# Patient Record
Sex: Male | Born: 1950 | Race: White | Hispanic: No | Smoking: Never smoker
Health system: Southern US, Community
[De-identification: ages and names within clinical notes are randomized; demographics above are authoritative.]

## PROBLEM LIST (undated history)

## (undated) DIAGNOSIS — T4145XA Adverse effect of unspecified anesthetic, initial encounter: Secondary | ICD-10-CM

## (undated) DIAGNOSIS — K859 Acute pancreatitis without necrosis or infection, unspecified: Secondary | ICD-10-CM

## (undated) DIAGNOSIS — I10 Essential (primary) hypertension: Secondary | ICD-10-CM

## (undated) DIAGNOSIS — I4891 Unspecified atrial fibrillation: Secondary | ICD-10-CM

## (undated) DIAGNOSIS — M4800 Spinal stenosis, site unspecified: Secondary | ICD-10-CM

## (undated) DIAGNOSIS — G0490 Encephalitis and encephalomyelitis, unspecified: Secondary | ICD-10-CM

## (undated) DIAGNOSIS — T8859XA Other complications of anesthesia, initial encounter: Secondary | ICD-10-CM

## (undated) DIAGNOSIS — L0390 Cellulitis, unspecified: Secondary | ICD-10-CM

## (undated) DIAGNOSIS — M459 Ankylosing spondylitis of unspecified sites in spine: Secondary | ICD-10-CM

## (undated) DIAGNOSIS — I639 Cerebral infarction, unspecified: Secondary | ICD-10-CM

## (undated) DIAGNOSIS — E119 Type 2 diabetes mellitus without complications: Secondary | ICD-10-CM

## (undated) DIAGNOSIS — R569 Unspecified convulsions: Secondary | ICD-10-CM

## (undated) HISTORY — PX: OTHER SURGICAL HISTORY: SHX169

## (undated) HISTORY — PX: TONSILLECTOMY: SUR1361

## (undated) HISTORY — PX: CATARACT EXTRACTION: SUR2

---

## 2011-04-16 DIAGNOSIS — G0490 Encephalitis and encephalomyelitis, unspecified: Secondary | ICD-10-CM

## 2011-04-16 HISTORY — DX: Encephalitis and encephalomyelitis, unspecified: G04.90

## 2015-03-28 ENCOUNTER — Encounter (HOSPITAL_COMMUNITY): Payer: Self-pay | Admitting: Emergency Medicine

## 2015-03-28 ENCOUNTER — Inpatient Hospital Stay (HOSPITAL_COMMUNITY)
Admission: EM | Admit: 2015-03-28 | Discharge: 2015-04-16 | DRG: 871 | Disposition: E | Payer: Medicare Other | Attending: Internal Medicine | Admitting: Internal Medicine

## 2015-03-28 ENCOUNTER — Emergency Department (HOSPITAL_COMMUNITY): Payer: Medicare Other

## 2015-03-28 ENCOUNTER — Inpatient Hospital Stay (HOSPITAL_COMMUNITY): Payer: Medicare Other

## 2015-03-28 DIAGNOSIS — L03119 Cellulitis of unspecified part of limb: Secondary | ICD-10-CM

## 2015-03-28 DIAGNOSIS — Z6828 Body mass index (BMI) 28.0-28.9, adult: Secondary | ICD-10-CM

## 2015-03-28 DIAGNOSIS — Z8661 Personal history of infections of the central nervous system: Secondary | ICD-10-CM | POA: Diagnosis not present

## 2015-03-28 DIAGNOSIS — Z7901 Long term (current) use of anticoagulants: Secondary | ICD-10-CM | POA: Diagnosis not present

## 2015-03-28 DIAGNOSIS — Z515 Encounter for palliative care: Secondary | ICD-10-CM | POA: Diagnosis not present

## 2015-03-28 DIAGNOSIS — Z794 Long term (current) use of insulin: Secondary | ICD-10-CM | POA: Diagnosis not present

## 2015-03-28 DIAGNOSIS — G894 Chronic pain syndrome: Secondary | ICD-10-CM | POA: Diagnosis present

## 2015-03-28 DIAGNOSIS — E1165 Type 2 diabetes mellitus with hyperglycemia: Secondary | ICD-10-CM | POA: Diagnosis present

## 2015-03-28 DIAGNOSIS — J9601 Acute respiratory failure with hypoxia: Secondary | ICD-10-CM | POA: Diagnosis present

## 2015-03-28 DIAGNOSIS — R1310 Dysphagia, unspecified: Secondary | ICD-10-CM | POA: Diagnosis present

## 2015-03-28 DIAGNOSIS — M459 Ankylosing spondylitis of unspecified sites in spine: Secondary | ICD-10-CM | POA: Diagnosis not present

## 2015-03-28 DIAGNOSIS — Z8673 Personal history of transient ischemic attack (TIA), and cerebral infarction without residual deficits: Secondary | ICD-10-CM | POA: Diagnosis not present

## 2015-03-28 DIAGNOSIS — L03116 Cellulitis of left lower limb: Secondary | ICD-10-CM | POA: Diagnosis not present

## 2015-03-28 DIAGNOSIS — E1140 Type 2 diabetes mellitus with diabetic neuropathy, unspecified: Secondary | ICD-10-CM | POA: Diagnosis present

## 2015-03-28 DIAGNOSIS — I482 Chronic atrial fibrillation: Secondary | ICD-10-CM | POA: Diagnosis present

## 2015-03-28 DIAGNOSIS — Z66 Do not resuscitate: Secondary | ICD-10-CM | POA: Diagnosis present

## 2015-03-28 DIAGNOSIS — L8990 Pressure ulcer of unspecified site, unspecified stage: Secondary | ICD-10-CM | POA: Diagnosis not present

## 2015-03-28 DIAGNOSIS — N390 Urinary tract infection, site not specified: Secondary | ICD-10-CM | POA: Diagnosis present

## 2015-03-28 DIAGNOSIS — E872 Acidosis: Secondary | ICD-10-CM | POA: Diagnosis present

## 2015-03-28 DIAGNOSIS — I10 Essential (primary) hypertension: Secondary | ICD-10-CM | POA: Diagnosis present

## 2015-03-28 DIAGNOSIS — A419 Sepsis, unspecified organism: Principal | ICD-10-CM | POA: Diagnosis present

## 2015-03-28 DIAGNOSIS — D638 Anemia in other chronic diseases classified elsewhere: Secondary | ICD-10-CM | POA: Diagnosis present

## 2015-03-28 DIAGNOSIS — Z7401 Bed confinement status: Secondary | ICD-10-CM | POA: Diagnosis not present

## 2015-03-28 DIAGNOSIS — Z9981 Dependence on supplemental oxygen: Secondary | ICD-10-CM | POA: Diagnosis not present

## 2015-03-28 DIAGNOSIS — Z79899 Other long term (current) drug therapy: Secondary | ICD-10-CM

## 2015-03-28 DIAGNOSIS — Z7952 Long term (current) use of systemic steroids: Secondary | ICD-10-CM

## 2015-03-28 DIAGNOSIS — F0390 Unspecified dementia without behavioral disturbance: Secondary | ICD-10-CM | POA: Diagnosis present

## 2015-03-28 DIAGNOSIS — E119 Type 2 diabetes mellitus without complications: Secondary | ICD-10-CM

## 2015-03-28 DIAGNOSIS — M549 Dorsalgia, unspecified: Secondary | ICD-10-CM | POA: Diagnosis present

## 2015-03-28 DIAGNOSIS — R4182 Altered mental status, unspecified: Secondary | ICD-10-CM | POA: Diagnosis not present

## 2015-03-28 DIAGNOSIS — R609 Edema, unspecified: Secondary | ICD-10-CM

## 2015-03-28 HISTORY — DX: Adverse effect of unspecified anesthetic, initial encounter: T41.45XA

## 2015-03-28 HISTORY — DX: Type 2 diabetes mellitus without complications: E11.9

## 2015-03-28 HISTORY — DX: Other complications of anesthesia, initial encounter: T88.59XA

## 2015-03-28 HISTORY — DX: Encephalitis and encephalomyelitis, unspecified: G04.90

## 2015-03-28 HISTORY — DX: Cerebral infarction, unspecified: I63.9

## 2015-03-28 HISTORY — DX: Acute pancreatitis without necrosis or infection, unspecified: K85.90

## 2015-03-28 HISTORY — DX: Spinal stenosis, site unspecified: M48.00

## 2015-03-28 HISTORY — DX: Essential (primary) hypertension: I10

## 2015-03-28 HISTORY — DX: Unspecified convulsions: R56.9

## 2015-03-28 HISTORY — DX: Cellulitis, unspecified: L03.90

## 2015-03-28 HISTORY — DX: Unspecified atrial fibrillation: I48.91

## 2015-03-28 HISTORY — DX: Ankylosing spondylitis of unspecified sites in spine: M45.9

## 2015-03-28 LAB — CBG MONITORING, ED
GLUCOSE-CAPILLARY: 318 mg/dL — AB (ref 65–99)
GLUCOSE-CAPILLARY: 359 mg/dL — AB (ref 65–99)
GLUCOSE-CAPILLARY: 380 mg/dL — AB (ref 65–99)
Glucose-Capillary: 367 mg/dL — ABNORMAL HIGH (ref 65–99)
Glucose-Capillary: 355 mg/dL — ABNORMAL HIGH (ref 65–99)

## 2015-03-28 LAB — URINALYSIS, ROUTINE W REFLEX MICROSCOPIC
Bilirubin Urine: NEGATIVE
Glucose, UA: >1000 mg/dL — AB
Ketones, ur: 40 mg/dL — AB
NITRITE: NEGATIVE
PH: 7 (ref 5.0–8.0)
Protein, ur: 30 mg/dL — AB
SPECIFIC GRAVITY, URINE: 1.018 (ref 1.005–1.030)

## 2015-03-28 LAB — I-STAT CHEM 8, ED
BUN: 15 mg/dL (ref 6–20)
CHLORIDE: 100 mmol/L — AB (ref 101–111)
CREATININE: 0.9 mg/dL (ref 0.61–1.24)
Calcium, Ion: 1.04 mmol/L — ABNORMAL LOW (ref 1.13–1.30)
Glucose, Bld: 401 mg/dL — ABNORMAL HIGH (ref 65–99)
HEMATOCRIT: 40 % (ref 39.0–52.0)
Hemoglobin: 13.6 g/dL (ref 13.0–17.0)
POTASSIUM: 3.7 mmol/L (ref 3.5–5.1)
SODIUM: 139 mmol/L (ref 135–145)
TCO2: 25 mmol/L (ref 0–100)

## 2015-03-28 LAB — URINE MICROSCOPIC-ADD ON: SQUAMOUS EPITHELIAL / LPF: NONE SEEN

## 2015-03-28 LAB — CBC
HEMATOCRIT: 36 % — AB (ref 39.0–52.0)
HEMOGLOBIN: 10.9 g/dL — AB (ref 13.0–17.0)
MCH: 26.1 pg (ref 26.0–34.0)
MCHC: 30.3 g/dL (ref 30.0–36.0)
MCV: 86.1 fL (ref 78.0–100.0)
Platelets: 299 10*3/uL (ref 150–400)
RBC: 4.18 MIL/uL — AB (ref 4.22–5.81)
RDW: 20.8 % — ABNORMAL HIGH (ref 11.5–15.5)
WBC: 14.2 10*3/uL — ABNORMAL HIGH (ref 4.0–10.5)

## 2015-03-28 LAB — COMPREHENSIVE METABOLIC PANEL
ALK PHOS: 106 U/L (ref 38–126)
ALT: 18 U/L (ref 17–63)
ANION GAP: 16 — AB (ref 5–15)
AST: 24 U/L (ref 15–41)
Albumin: 2.4 g/dL — ABNORMAL LOW (ref 3.5–5.0)
BILIRUBIN TOTAL: 0.8 mg/dL (ref 0.3–1.2)
BUN: 15 mg/dL (ref 6–20)
CALCIUM: 8.6 mg/dL — AB (ref 8.9–10.3)
CO2: 25 mmol/L (ref 22–32)
CREATININE: 1.11 mg/dL (ref 0.61–1.24)
Chloride: 99 mmol/L — ABNORMAL LOW (ref 101–111)
Glucose, Bld: 403 mg/dL — ABNORMAL HIGH (ref 65–99)
Potassium: 3.8 mmol/L (ref 3.5–5.1)
SODIUM: 140 mmol/L (ref 135–145)
TOTAL PROTEIN: 6.4 g/dL — AB (ref 6.5–8.1)

## 2015-03-28 LAB — LIPASE, BLOOD: LIPASE: 20 U/L (ref 11–51)

## 2015-03-28 LAB — I-STAT CG4 LACTIC ACID, ED
LACTIC ACID, VENOUS: 6.66 mmol/L — AB (ref 0.5–2.0)
Lactic Acid, Venous: 3.01 mmol/L (ref 0.5–2.0)

## 2015-03-28 LAB — LACTIC ACID, PLASMA: Lactic Acid, Venous: 2.9 mmol/L (ref 0.5–2.0)

## 2015-03-28 MED ORDER — VANCOMYCIN HCL IN DEXTROSE 1-5 GM/200ML-% IV SOLN
1000.0000 mg | Freq: Three times a day (TID) | INTRAVENOUS | Status: DC
  Administered 2015-03-28 – 2015-03-31 (×9): 1000 mg via INTRAVENOUS
  Filled 2015-03-28 (×11): qty 200

## 2015-03-28 MED ORDER — MORPHINE SULFATE (PF) 2 MG/ML IV SOLN
2.0000 mg | Freq: Once | INTRAVENOUS | Status: AC
  Administered 2015-03-28: 2 mg via INTRAVENOUS
  Filled 2015-03-28: qty 1

## 2015-03-28 MED ORDER — APIXABAN 5 MG PO TABS
5.0000 mg | ORAL_TABLET | Freq: Two times a day (BID) | ORAL | Status: DC
  Filled 2015-03-28 (×5): qty 1

## 2015-03-28 MED ORDER — VANCOMYCIN HCL 10 G IV SOLR
2000.0000 mg | Freq: Once | INTRAVENOUS | Status: DC
  Filled 2015-03-28: qty 2000

## 2015-03-28 MED ORDER — SODIUM CHLORIDE 0.9 % IV BOLUS (SEPSIS)
1000.0000 mL | Freq: Once | INTRAVENOUS | Status: AC
  Administered 2015-03-28: 1000 mL via INTRAVENOUS

## 2015-03-28 MED ORDER — ACETAMINOPHEN 650 MG RE SUPP
650.0000 mg | Freq: Once | RECTAL | Status: AC
  Administered 2015-03-28: 650 mg via RECTAL
  Filled 2015-03-28: qty 1

## 2015-03-28 MED ORDER — PIPERACILLIN-TAZOBACTAM 3.375 G IVPB
3.3750 g | Freq: Three times a day (TID) | INTRAVENOUS | Status: DC
  Administered 2015-03-28 – 2015-03-31 (×9): 3.375 g via INTRAVENOUS
  Filled 2015-03-28 (×11): qty 50

## 2015-03-28 MED ORDER — SODIUM CHLORIDE 0.9 % IV SOLN
2.5000 mg/h | INTRAVENOUS | Status: DC
  Administered 2015-03-28: 0.25 mg/h via INTRAVENOUS
  Administered 2015-03-30: 0.5 mg/h via INTRAVENOUS
  Filled 2015-03-28 (×4): qty 5

## 2015-03-28 MED ORDER — IPRATROPIUM-ALBUTEROL 0.5-2.5 (3) MG/3ML IN SOLN: 3.0000 mL | Freq: Four times a day (QID) | RESPIRATORY_TRACT | Status: DC | PRN

## 2015-03-28 MED ORDER — VANCOMYCIN HCL 10 G IV SOLR
1500.0000 mg | Freq: Once | INTRAVENOUS | Status: AC
  Administered 2015-03-28: 1500 mg via INTRAVENOUS
  Filled 2015-03-28: qty 1000

## 2015-03-28 MED ORDER — ALBUTEROL SULFATE (2.5 MG/3ML) 0.083% IN NEBU: 2.5000 mg | INHALATION_SOLUTION | RESPIRATORY_TRACT | Status: DC | PRN

## 2015-03-28 MED ORDER — ONDANSETRON HCL 4 MG/2ML IJ SOLN
4.0000 mg | Freq: Once | INTRAMUSCULAR | Status: AC
  Administered 2015-03-28: 4 mg via INTRAVENOUS
  Filled 2015-03-28: qty 2

## 2015-03-28 MED ORDER — ONDANSETRON HCL 4 MG/2ML IJ SOLN: 4.0000 mg | Freq: Four times a day (QID) | INTRAMUSCULAR | Status: DC | PRN

## 2015-03-28 MED ORDER — PIPERACILLIN-TAZOBACTAM 3.375 G IVPB 30 MIN
3.3750 g | Freq: Once | INTRAVENOUS | Status: AC
  Administered 2015-03-28: 3.375 g via INTRAVENOUS
  Filled 2015-03-28: qty 50

## 2015-03-28 MED ORDER — ACETAMINOPHEN 650 MG RE SUPP
650.0000 mg | Freq: Four times a day (QID) | RECTAL | Status: DC | PRN
  Administered 2015-03-28: 650 mg via RECTAL
  Filled 2015-03-28: qty 1

## 2015-03-28 MED ORDER — MORPHINE SULFATE (PF) 2 MG/ML IV SOLN
1.0000 mg | INTRAVENOUS | Status: DC | PRN
Start: 2015-03-28 — End: 2015-03-28
  Filled 2015-03-28: qty 1

## 2015-03-28 MED ORDER — SODIUM CHLORIDE 0.9 % IV BOLUS (SEPSIS)
1000.0000 mL | INTRAVENOUS | Status: AC
  Administered 2015-03-28 (×2): 1000 mL via INTRAVENOUS

## 2015-03-28 MED ORDER — INSULIN ASPART 100 UNIT/ML ~~LOC~~ SOLN
0.0000 [IU] | Freq: Three times a day (TID) | SUBCUTANEOUS | Status: DC
  Administered 2015-03-28: 9 [IU] via SUBCUTANEOUS
  Administered 2015-03-29 – 2015-03-30 (×4): 7 [IU] via SUBCUTANEOUS
  Filled 2015-03-28 (×2): qty 1

## 2015-03-28 MED ORDER — HYDROMORPHONE BOLUS VIA INFUSION
1.0000 mg | INTRAVENOUS | Status: DC | PRN
  Administered 2015-03-29 – 2015-03-31 (×30): 1 mg via INTRAVENOUS
  Filled 2015-03-28 (×23): qty 1

## 2015-03-28 MED ORDER — ONDANSETRON HCL 4 MG PO TABS: 4.0000 mg | ORAL_TABLET | Freq: Four times a day (QID) | ORAL | Status: DC | PRN

## 2015-03-28 MED ORDER — SODIUM CHLORIDE 0.9 % IV SOLN
INTRAVENOUS | Status: DC
  Administered 2015-03-28: 19:00:00 via INTRAVENOUS

## 2015-03-28 MED ORDER — SODIUM CHLORIDE 0.9 % IV BOLUS (SEPSIS)
500.0000 mL | INTRAVENOUS | Status: AC
  Administered 2015-03-28: 500 mL via INTRAVENOUS

## 2015-03-28 MED ORDER — HYDROCORTISONE NA SUCCINATE PF 100 MG IJ SOLR
100.0000 mg | Freq: Three times a day (TID) | INTRAMUSCULAR | Status: DC
  Administered 2015-03-28 – 2015-03-30 (×6): 100 mg via INTRAVENOUS
  Filled 2015-03-28 (×9): qty 2

## 2015-03-28 MED ORDER — ACETAMINOPHEN 325 MG PO TABS: 650.0000 mg | ORAL_TABLET | Freq: Four times a day (QID) | ORAL | Status: DC | PRN

## 2015-03-28 NOTE — ED Notes (Signed)
Lactic acid 2.9, Joanna,RN notified

## 2015-03-28 NOTE — Consult Note (Signed)
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 He recently moved to [facility] to be close to his sister- he has a son that lives in [place] who is his healthcare power of attorney and is currently on his way to [place] [facility]. 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 He has been in and out of skilled nursing homes and in assisted living type environments since that time. When he is not critically ill he has full capacity and has been making his own decisions regarding his care, he has left explicit instructions with his family regarding his wishes should he have a terminal illness or progressive decline -he does not want any aggressive resuscitation measures including pressors for serious sepsis. He wants his #1 priority to be adequate pain control and comfort with whatever time he has left he is open to a trial of IV fluids and IV antibiotics to treat any reversible source of infection or sepsis. He is most concerned about his comfort -because of his prior history with opiates he has been under managed for pain in teh past in the acute care setting therefore specifically requested palliative care consult.  Contacts/Participants in Discussion:Patient, Donald Flynn Primary Decision Maker: Donald Flynn Relationship to Patient son HCPOA: yes    SUMMARY OF RECOMMENDATIONS  Code Status/Advance Care Planning: DNR    Code Status Orders        Start     Ordered   2015-03-29 1556  Do not attempt resuscitation (DNR)   Continuous    Question Answer Comment  In the event of cardiac or respiratory ARREST Do not call a "code blue"   In the event of cardiac or respiratory ARREST Do not perform Intubation, CPR, defibrillation or ACLS   In the event of cardiac or respiratory ARREST Use medication by any route, position, wound care, and other measures to relive pain and suffering. May use oxygen, suction and manual treatment of airway obstruction as needed for comfort.      29-Mar-2015 1555      Other Directives:MOST Form  Symptom Management:   Pain: Chronic Pain and Acute pain from LE cellulitis -opiate tolerance-was previouslyon oxycodone  every 4 hours.   Will start him on low dose infusion of hydromorphone 0.25mg /hr and bolus PRN with  hydromorphone every 30 minutes-will see what his needs areover next 12 hours and adjust his infusion- if he improves we will transition him to an oral or sublingual regimen.  Ativan for sleep PRN  Stress dose steroids will be critical for him  Will also add on Lyrica in PM for neuropathic pain control  Palliative Prophylaxis:   Aspiration, Bowel Regimen, Delirium Protocol, Frequent Pain Assessment, Oral Care and Turn Reposition  Additional Recommendations (Limitations, Scope, Preferences):  Avoid Hospitalization, Minimize Medications and Full Scope Treatment  24-48 hours of IV fluids and IV Antibiotics-  if he doesn't respond will shift to FULL COMFORT CARE  Psycho-social/Spiritual:  Support System: Strong Desire for further Chaplaincy support:yes Additional Recommendations: Caregiving  Support/Resources  Prognosis: Hours - Days- likely will not survive this hospitalization-family aware  Discharge  Planning: TBD   Chief Complaint/ Primary Diagnoses: Present on Admission:  . Sepsis (HCC)  I have reviewed the medical record, interviewed the patient and family, and examined the patient. The following aspects are pertinent.  Past Medical History  Diagnosis Date  . Ankylosing spondylitis (HCC)   . Spinal stenosis   . CVA (cerebral infarction)   . A-fib (HCC)   . Diabetes mellitus without complication (HCC)     Type 2  . Encephalitis 2013  . Hypertension   . Seizures (HCC)     During septic episode.  . Pancreatitis   . Cellulitis   . Complication of anesthesia    Social History   Social History  . Marital Status: Widowed    Spouse Name: N/A  . Number of Children: N/A  . Years of Education: N/A   Social History Main Topics  . Smoking status: Never Smoker   . Smokeless tobacco: Never Used  . Alcohol Use: No  . Drug Use: No  . Sexual Activity: No   Other Topics Concern  . None   Social History Narrative  . None   History reviewed. No pertinent family history. Scheduled Meds: . hydrocortisone sod succinate (SOLU-CORTEF) inj  100 mg Intravenous Q8H  . insulin aspart  0-9 Units Subcutaneous TID WC   Continuous Infusions: . sodium chloride    . HYDROmorphone    . piperacillin-tazobactam (ZOSYN)  IV    . vancomycin     PRN Meds:.acetaminophen **OR** acetaminophen, albuterol, HYDROmorphone, ondansetron **OR** ondansetron (ZOFRAN) IV Medications Prior to Admission:  Prior to Admission medications   Medication Sig Start Date End Date Taking? Authorizing Provider  acetaminophen (TYLENOL) 500 MG tablet Take 1,000 mg by mouth 3 (three) times daily.   Yes Historical Provider, MD  alum & mag hydroxide-simeth (MINTOX) 200-200-20 MG/5ML suspension Take 30 mLs by mouth every 4 (four) hours as needed for indigestion or heartburn.   Yes Historical Provider, MD  Amino Acids-Protein Hydrolys (FEEDING SUPPLEMENT, PRO-STAT SUGAR FREE 64,) LIQD Take 30 mLs by mouth daily.   Yes  Historical Provider, MD  apixaban (ELIQUIS) 5 MG TABS tablet Take 5 mg by mouth 2 (two) times daily.   Yes Historical Provider, MD  atenolol (TENORMIN) 50 MG tablet Take 50 mg by mouth daily.   Yes Historical Provider, MD  atorvastatin (LIPITOR) 40 MG tablet Take 40 mg by mouth at bedtime.   Yes Historical Provider, MD  bumetanide (BUMEX) 1 MG tablet Take 1 mg by mouth daily.   Yes Historical Provider, MD  carboxymethylcellulose (REFRESH PLUS) 0.5 % SOLN Place 2 drops into both eyes 2 (two) times daily.   Yes Historical Provider, MD  fluconazole (DIFLUCAN) 150 MG tablet Take 150 mg by mouth daily. Started 12/07 for 10 days   Yes Historical Provider, MD  gabapentin (NEURONTIN) 100 MG capsule Take 200 mg by mouth 3 (three) times daily.   Yes Historical Provider, MD  insulin aspart (NOVOLOG) 100 UNIT/ML injection Inject 2-13 Units into the skin 4 (four) times daily -  before meals and at bedtime. Per sliding scale   Yes Historical Provider, MD  insulin detemir (LEVEMIR) 100 UNIT/ML injection Inject 28 Units into the skin at bedtime.   Yes Historical Provider, MD  insulin regular (  NOVOLIN R,HUMULIN R) 100 units/mL injection Inject 6 Units into the skin 2 (two) times daily.   Yes Historical Provider, MD  ipratropium-albuterol (DUONEB) 0.5-2.5 (3) MG/3ML SOLN Take 3 mLs by nebulization every 2 (two) hours as needed (for shortness of breath and wheezing).   Yes Historical Provider, MD  isosorbide mononitrate (IMDUR) 60 MG 24 hr tablet Take 60 mg by mouth daily.   Yes Historical Provider, MD  lidocaine (LIDODERM) 5 % Place 2 patches onto the skin at bedtime. Remove & Discard patch within 12 hours or as directed by MD   Yes Historical Provider, MD  LORazepam (ATIVAN) 1 MG tablet Take 1 mg by mouth every 8 (eight) hours as needed for anxiety (and agaitatin).   Yes Historical Provider, MD  magnesium hydroxide (MILK OF MAGNESIA) 400 MG/5ML suspension Take 30 mLs by mouth daily as needed for mild constipation.    Yes Historical Provider, MD  magnesium oxide (MAG-OX) 400 MG tablet Take 400 mg by mouth daily.   Yes Historical Provider, MD  megestrol (MEGACE) 400 MG/10ML suspension Take 400 mg by mouth daily.   Yes Historical Provider, MD  Menthol-Zinc Oxide (RISAMINE) 0.44-20.625 % OINT Apply 1 application topically 2 (two) times daily. Apply to bilat buttock and inner thighs until healed   Yes Historical Provider, MD  nitroGLYCERIN (NITROSTAT) 0.4 MG SL tablet Place 0.4 mg under the tongue every 5 (five) minutes as needed for chest pain.   Yes Historical Provider, MD  nystatin (MYCOSTATIN/NYSTOP) 100000 UNIT/GM POWD Apply 1 g topically 3 (three) times daily. Apply to perineal and buttocks   Yes Historical Provider, MD  ondansetron (ZOFRAN) 4 MG tablet Take 4 mg by mouth every 6 (six) hours as needed for nausea or vomiting.   Yes Historical Provider, MD  Oxycodone HCl 10 MG TABS Take 10 mg by mouth every 4 (four) hours while awake.   Yes Historical Provider, MD  Oxycodone HCl 10 MG TABS Take 10 mg by mouth every 4 (four) hours as needed (for pain).   Yes Historical Provider, MD  OXYGEN Inhale 3 L into the lungs at bedtime.   Yes Historical Provider, MD  OXYGEN Inhale 3 L into the lungs See admin instructions. Can use in morning if needed for shortness of breath   Yes Historical Provider, MD  pantoprazole (PROTONIX) 40 MG tablet Take 40 mg by mouth 2 (two) times daily.   Yes Historical Provider, MD  polyethylene glycol (MIRALAX / GLYCOLAX) packet Take 17 g by mouth daily.   Yes Historical Provider, MD  potassium chloride SA (K-DUR,KLOR-CON) 20 MEQ tablet Take 20 mEq by mouth 2 (two) times daily.   Yes Historical Provider, MD  predniSONE (DELTASONE) 20 MG tablet Take 20 mg by mouth daily. Takes along with a 5mg  tablet to make a total of 35mg  a day   Yes Historical Provider, MD  predniSONE (DELTASONE) 5 MG tablet Take 5 mg by mouth daily. Takes along with a 20mg  tablet to make a total of 35mg  a day   Yes Historical  Provider, MD  psyllium (METAMUCIL) 58.6 % packet Take 1 packet by mouth 3 (three) times daily with meals as needed (for indigestion).   Yes Historical Provider, MD   Allergies  Allergen Reactions  . Asa [Aspirin]     Unknown reaction Per MAR   . Hydrocodone     Unknown reaction Per MAR   . Lisinopril     Unknown reaction Per MAR   . Nsaids  Unknown reaction Per MAR   . Paxil [Paroxetine]     Unknown reaction Per MAR   . Vicodin [Hydrocodone-Acetaminophen]     Unknown reaction Per MAR   . Vytorin [Ezetimibe-Simvastatin]     Unknown reaction Per MAR   . Zetia [Ezetimibe]     Unknown reaction Per MAR   . Zoloft [Sertraline]     Unknown reaction Per MAR     Review of Systems  Unable to perform ROS   Physical Exam  Constitutional: He appears lethargic. He appears toxic. He has a sickly appearance.  Musculoskeletal:  Severe left lower extremity cellulitis- poor capillary refill below red demarcation, multiple skin lesions, pale  Neurological: He appears lethargic.    Vital Signs: BP 95/43 mmHg  Pulse 115  Temp(Src) 102 F (38.9 C) (Rectal)  Resp 32  Ht 5\' 8"  (1.727 m)  Wt 83.915 kg (185 lb)  BMI 28.14 kg/m2  SpO2 95%  SpO2: SpO2: 95 % O2 Device:SpO2: 95 % O2 Flow Rate: .O2 Flow Rate (L/min): 5 L/min  IO: Intake/output summary: No intake or output data in the 24 hours ending 04/09/2015 1712  LBM:   Baseline Weight: Weight: 83.915 kg (185 lb) Most recent weight: Weight: 83.915 kg (185 lb)      Palliative Assessment/Data:  Flowsheet Rows        Most Recent Value   Intake Tab    Referral Department  Hospitalist   Unit at Time of Referral  ER   Palliative Care Primary Diagnosis  Sepsis/Infectious Disease   Date Notified  09-Apr-2015   Palliative Care Type  New Palliative care   Reason for referral  Pain, Clarify Goals of Care   Date of Admission  Apr 09, 2015   Date first seen by Palliative Care  2015/04/09   # of days Palliative referral response time  0 Day(s)   #  of days IP prior to Palliative referral  0   Clinical Assessment    Palliative Performance Scale Score  20%   Pain Max last 24 hours  10   Pain Min Last 24 hours  10   Dyspnea Max Last 24 Hours  5   Dyspnea Min Last 24 hours  5   Nausea Max Last 24 Hours  Not able to report   Nausea Min Last 24 Hours  Not able to report   Anxiety Max Last 24 Hours  Not able to report   Anxiety Min Last 24 Hours  Not able to report   Other Max Last 24 Hours  Not able to report   Psychosocial & Spiritual Assessment    Palliative Care Outcomes    Patient/Family meeting held?  No   Palliative Care Outcomes  Clarified goals of care   Patient/Family wishes: Interventions discontinued/not started   Mechanical Ventilation, Vasopressors, BiPAP, NIPPV   Palliative Care follow-up planned  Yes, Facility      Additional Data Reviewed:  CBC:    Component Value Date/Time   WBC 14.2* Apr 09, 2015 1152   HGB 13.6 04-09-15 1228   HCT 40.0 Apr 09, 2015 1228   PLT 299 2015/04/09 1152   MCV 86.1 2015/04/09 1152   Comprehensive Metabolic Panel:    Component Value Date/Time   NA 139 Apr 09, 2015 1228   K 3.7 04/09/2015 1228   CL 100* 04-09-15 1228   CO2 25 04-09-15 1152   BUN 15 04-09-2015 1228   CREATININE 0.90 04-09-2015 1228   GLUCOSE 401* 04-09-2015 1228   CALCIUM 8.6* 04/09/15 1152  AST 24 04/11/2015 1152   ALT 18 03/23/2015 1152   ALKPHOS 106 04/06/2015 1152   BILITOT 0.8 03/18/2015 1152   PROT 6.4* 04/07/2015 1152   ALBUMIN 2.4* 04/07/2015 1152     Time In: 4:30 Time Out: 5:30  Time Total: 60 minutes Greater than 50%  of this time was spent counseling and coordinating care related to the above assessment and plan.  Signed by: Hilbert OdorGOLDING,ELIZABETH, DO  Elizabeth L Golding, DO  03/27/2015, 5:12 PM  Please contact Palliative Medicine Team phone at [phone] for questions and concerns.

## 2015-03-28 NOTE — ED Notes (Addendum)
Per EMS pt here for altered mental status. Pt is febrile, tachypneic, hypotensive, tachycardic, and has decreased LOC. Decubitus ulcers cover buttocks, significant hot red cellulitis to left leg, painful open wounds to forearms bilaterally. Pt's mental baseline is dementia, disorientation, but alert and conversational.  Pt 82% O2 on room air, normally wears 2L/min O2 at home, requires 5 L/min O2 currently to maintain O2 saturation of 93%.

## 2015-03-28 NOTE — ED Notes (Signed)
Bed: RESA Expected date:  Expected time:  Means of arrival:  Comments: Ems-? sepsis

## 2015-03-28 NOTE — H&P (Addendum)
[facility] History and Physical  [name] ZOX:[id number] DOB: [birth year] DOA: 03/25/2015  Referring physician: Dr Adela Lank PCP: Florentina Jenny, Flynn   Chief Complaint: AMS  HPI: Donald Flynn  is a 64 y.o. male HTN, Seizure, Diabetes, Encephalitis, A fib, CVA, patient has had multiple admissions at [facility] in [place] , for sepsis.  Patient was found to have altered mental status today. He  Moved  2 weeks ago from [place] to lives near his sister. He was found to be lethargic,   Per sister patient has history of ankylosis spondylitis, he was on prednisone. He them develops diabetes. Neuropathy. He has herpes encephalitis 2013.  He has had multiples admission for sepsis, secondary to foot infection. He is on chronic pain medications. She is requesting palliative care consult for symptoms management and goals of care.   Evaluation in the: Lactic acid initially at 6, after fluids has decrease to 3, UA with too numerous to count WBC. He was febrile, hypotensive.   Review of Systems:  Limited due to patient condition.   Past Medical History  Diagnosis Date  . Ankylosing spondylitis (HCC)   . Spinal stenosis   . CVA (cerebral infarction)   . A-fib (HCC)   . Diabetes mellitus without complication (HCC)     Type 2  . Encephalitis 2013  . Hypertension   . Seizures (HCC)     During septic episode.  . Pancreatitis   . Cellulitis   . Complication of anesthesia    Past Surgical History  Procedure Laterality Date  . Vertebral plasty    . Tonsillectomy    . Cataract extraction     Social History:  reports that he has never smoked. He has never used smokeless tobacco. He reports that he does not drink alcohol or use illicit drugs.  Allergies  Allergen Reactions  . Asa [Aspirin]     Unknown reaction Per MAR   . Hydrocodone     Unknown reaction Per MAR   . Lisinopril     Unknown reaction Per MAR   . Nsaids     Unknown reaction Per MAR   . Paxil [Paroxetine]     Unknown  reaction Per MAR   . Vicodin [Hydrocodone-Acetaminophen]     Unknown reaction Per MAR   . Vytorin [Ezetimibe-Simvastatin]     Unknown reaction Per MAR   . Zetia [Ezetimibe]     Unknown reaction Per MAR   . Zoloft [Sertraline]     Unknown reaction Per MAR     Family History; unable to obtain from patient.   Prior to Admission medications   Not on File   Physical Exam: Filed Vitals:   03/17/2015 1615 04/09/2015 1630 04/09/2015 1640 04/10/2015 1645  BP: 145/53 90/44 98/53  107/45  Pulse: 123  119 117  Temp: 102.4 F (39.1 C) 102.4 F (39.1 C) 102.4 F (39.1 C) 102.2 F (39 C)  TempSrc:      Resp: Height:      Weight:      SpO2: 96%  98% 97%    Wt Readings from Last 3 Encounters:  04/12/2015 83.915 kg (185 lb)    General:  Patient lethargic, open eyes to voice Eyes: PERRL, normal lids, irises & conjunctiva ENT: grossly normal hearing, lips & tongue Neck: no LAD, masses or thyromegaly Cardiovascular: RRR, no m/r/g. No LE edema. Telemetry: SR, no arrhythmias  Respiratory: Mild respiratory distress, bilateral ronchus.  Abdomen: soft, ntnd Skin: left Lower  extremity with redness, old scars.  Musculoskeletal: grossly normal tone BUE/BLE Neurologic: lethargic, open eyes to voice.           Labs on Admission:  Basic Metabolic Panel:  Recent Labs Lab 11-30-14 1152 11-30-14 1228  NA 140 139  K 3.8 3.7  CL 99* 100*  CO2 25  --   GLUCOSE 403* 401*  BUN 15 15  CREATININE 1.11 0.90  CALCIUM 8.6*  --    Liver Function Tests:  Recent Labs Lab 11-30-14 1152  AST 24  ALT 18  ALKPHOS 106  BILITOT 0.8  PROT 6.4*  ALBUMIN 2.4*    Recent Labs Lab 11-30-14 1204  LIPASE 20   No results for input(s): AMMONIA in the last 168 hours. CBC:  Recent Labs Lab 11-30-14 1152 11-30-14 1228  WBC 14.2*  --   HGB 10.9* 13.6  HCT 36.0* 40.0  MCV 86.1  --   PLT 299  --    Cardiac Enzymes: No results for input(s): CKTOTAL, CKMB, CKMBINDEX, TROPONINI in the  last 168 hours.  BNP (last 3 results) No results for input(s): BNP in the last 8760 hours.  ProBNP (last 3 results) No results for input(s): PROBNP in the last 8760 hours.  CBG:  Recent Labs Lab 11-30-14 1154 11-30-14 1335 11-30-14 1651  GLUCAP 367* 355* 359*    Radiological Exams on Admission: Dg Chest 2 View  07-22-2014  CLINICAL DATA:  Altered mental status, fever, tachypnea, hypotension, tachycardia, decreased level of consciousness, cellulitis LEFT leg, baseline dementia and disorientation, diabetes mellitus, hypertension, stroke, atrial fibrillation EXAM: CHEST  2 VIEW COMPARISON:  None FINDINGS: Elevation of RIGHT diaphragm. Normal heart size. Pulmonary vascular congestion. Elongation of thoracic aorta. Atelectasis in mid to lower RIGHT lung. Question LEFT nipple shadow. Minimal linear scarring or subsegmental atelectasis LEFT base. Remaining lungs clear. No gross pleural effusion or pneumothorax. Sclerosis at old lateral RIGHT rib fractures. Multiple prior spinal augmentation procedures at thoracolumbar spine. IMPRESSION: RIGHT basilar atelectasis with minimal atelectasis or scarring at LEFT base. Question LEFT nipple shadow; followup chest radiograph with nipple markers recommended to exclude pulmonary nodule. Electronically Signed   By: Ulyses SouthwardMark  Boles M.D.   On: 07-22-2014 13:34   Dg Tibia/fibula Left  07-22-2014  CLINICAL DATA:  Left lower extremity cellulitis. EXAM: LEFT TIBIA AND FIBULA - 2 VIEW COMPARISON:  None. FINDINGS: The knee and ankle joints are maintained. No acute bony findings or destructive bony changes. No gas is seen in the soft tissues. IMPRESSION: No acute bony findings. Electronically Signed   By: Rudie MeyerP.  Gallerani M.D.   On: 07-22-2014 13:23   Dg Foot Complete Left  07-22-2014  CLINICAL DATA:  Left lower leg and foot erythema and edema of uncertain duration in a diabetic patient. Initial encounter. EXAM: LEFT FOOT - COMPLETE 3+ VIEW COMPARISON:  None. FINDINGS:  There is difficulty positioning the patient. The patient has a fracture through the midshaft of the distal phalanx of the left great toe. The fracture is minimally displaced. No other bony or joint abnormality is identified. No radiopaque foreign body or soft tissue gas collection is seen. IMPRESSION: Mildly displaced fracture distal phalanx of the left great toe. Given the patient's history, coexistent infection in this location is possible. Recommend clinical correlation for adjacent soft tissue wound. Electronically Signed   By: Drusilla Kannerhomas  Dalessio M.D.   On: 07-22-2014 13:24    EKG: Independently reviewed. Sinus Tachycardia  Assessment/Plan Active Problems:   Sepsis (HCC)   UTI (lower urinary  tract infection)   Cellulitis of lower extremity   Diabetes (HCC)   Decubitus skin ulcer  1-Sepsis: In setting of LE cellulitis and UTI.  Patient presents with lactic acid at 6, Hypotensive SBP in the 80, febrile, tachycardic.  Patient does not want IV pressors.  Continue with IV fluids, IV antibiotics. Vancomycin and Zosyn.  He is on chronic steroids, will start stress dose hydrocortisone.   2-Diabetes, Uncontrolled:  SSI.   3-Acute hypoxic respiratory Failure;  Patient does not want to be intubated.  Nebulizer PRN. Oxygen as needed   4-Chronic pain: Palliative care consulted for symptoms management.   5-A fib: resume eliquis. Hold atenolol.   6-Left LE cellulitis; IV antibiotics. If no improvement might need CT scan.   Patient with multiple medical problems and sepsis. Palliative care consulted for goals of care,.   Code Status: DO NOT RESUSCITATE DO NOT INTUBATE DVT Prophylaxis: Lovenox Family Communication: Care discussed with sister who was at bedside Disposition Plan: Suspect 4-5 days inpatient  Time spent: 75 minutes.   Hartley Barefoot A [facility] Pager [phone]

## 2015-03-28 NOTE — ED Notes (Signed)
Delay in lab draw, MD in room with pt 

## 2015-03-28 NOTE — Progress Notes (Signed)
ANTIBIOTIC CONSULT NOTE - INITIAL  Pharmacy Consult for Vancomycin & Zosyn Indication: Sepsis  Allergies  Allergen Reactions  . Hydrocodone     Patient Measurements: Height: 5\' 8"  (172.7 cm) Weight: 185 lb (83.915 kg) IBW/kg (Calculated) : 68.4  Vital Signs: Temp: 102.4 F (39.1 C) (12/13 1530) Temp Source: Rectal (12/13 1459) BP: 110/57 mmHg (12/13 1530) Pulse Rate: 123 (12/13 1500) Intake/Output from previous day:   Intake/Output from this shift:    Labs:  Recent Labs  Dec 30, 2014 1152 Dec 30, 2014 1228  WBC 14.2*  --   HGB 10.9* 13.6  PLT 299  --   CREATININE 1.11 0.90   Estimated Creatinine Clearance: 88.6 mL/min (by C-G formula based on Cr of 0.9). No results for input(s): VANCOTROUGH, VANCOPEAK, VANCORANDOM, GENTTROUGH, GENTPEAK, GENTRANDOM, TOBRATROUGH, TOBRAPEAK, TOBRARND, AMIKACINPEAK, AMIKACINTROU, AMIKACIN in the last 72 hours.   Microbiology: No results found for this or any previous visit (from the past 720 hour(s)).  Medical History: Past Medical History  Diagnosis Date  . Ankylosing spondylitis (HCC)   . Spinal stenosis   . CVA (cerebral infarction)   . A-fib (HCC)   . Diabetes mellitus without complication (HCC)     Type 2  . Encephalitis 2013  . Hypertension   . Seizures (HCC)     During septic episode.  . Pancreatitis   . Cellulitis   . Complication of anesthesia     Medications:  Scheduled:  . insulin aspart  0-9 Units Subcutaneous TID WC   Infusions:  . piperacillin-tazobactam (ZOSYN)  IV    . vancomycin     Assessment:  [age] yr male presented with altered mental status.  Patient found to be febrile with increased HR, low BP, decubitus ulcers on buttocks and left leg cellulitis  Code Sepsis called  Patient received initial Vancomyin 1500mg  IV x 1 (@ 12:26) and Zosyn 3.375gm IV x 1 (@ 12:14) in the ED  CrCl (n) = 85 ml/min  Pharmacy consulted to continue dosing Vancomycin and Zosyn for sepsis.  Goal of Therapy:  Vancomycin  trough level 15-20 mcg/ml  Plan:  Measure antibiotic drug levels at steady state Follow up culture results  Zosyn 3.375gm IV q8h (each dose infused over 4 hrs) Vancomycin 1gm IV q8h  Rayan Ines, Joselyn GlassmanLeann Trefz, PharmD 03/18/15,3:50 PM

## 2015-03-28 NOTE — Progress Notes (Signed)
CSW attempted to meet with patient at bedside. However, nurse was present conducting a procedure. CSW will attempt to follow up.  Trish MageBrittney Angelea Penny, LCSWA [phone] ED CSW 04/07/2015 4:10 PM

## 2015-03-28 NOTE — Progress Notes (Signed)
64 yr old male medicare pt with [facility] [place] address Pt not awake when Cm visited Male family member at bedside  Family member confirm pt at snf and seen by Florentina JennyHenry Tripp  [facility]

## 2015-03-28 NOTE — ED Provider Notes (Signed)
CSN: [id number]     Arrival date & time 2014/12/28  1138 History   First MD Initiated Contact with Patient 2014/12/28 1148     Chief Complaint  Patient presents with  . Altered Mental Status     (Consider location/radiation/quality/duration/timing/severity/associated sxs/prior Treatment) Patient is a 64 y.o. male presenting with altered mental status. The history is provided by the patient.  Altered Mental Status Presenting symptoms: confusion   Severity:  Severe Most recent episode:  Today Episode history:  Continuous Duration:  2 days Timing:  Constant Progression:  Worsening Chronicity:  New Context: recent illness and recent infection   Context: not head injury   Associated symptoms: fever and weakness   Associated symptoms: no abdominal pain, no headaches, no palpitations, no rash and no vomiting     64 yo M with a chief complaints of altered mental status. Family is concerned that he is septic. Patient has had multiple presentations similar to this in the past couple months. Patient has had a precipitous decline is been bedbound for the past couple weeks. Hypoxic on arrival requiring 5 L oxygen. Family states that he has recently had pancreatitis as well as multiple lung infections and encephalitis. States that he currently looks much better than he did last time he went to the hospital. Patient is usually seen [facility] in [place]. 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   Past Medical History  Diagnosis Date  . Ankylosing spondylitis (HCC)   . Spinal stenosis   . CVA (cerebral infarction)   . A-fib (HCC)   . Diabetes mellitus without complication (HCC)     Type 2  . Encephalitis 2013  . Hypertension   . Seizures (HCC)     During septic episode.  . Pancreatitis   . Cellulitis    Past Surgical History  Procedure Laterality Date  . Vertebral plasty    . Tonsillectomy    . Cataract extraction     No family history on file. Social History  Substance Use  Topics  . Smoking status: None  . Smokeless tobacco: None  . Alcohol Use: No    Review of Systems  Constitutional: Positive for fever. Negative for chills.  HENT: Negative for congestion and facial swelling.   Eyes: Negative for discharge and visual disturbance.  Respiratory: Negative for shortness of breath.   Cardiovascular: Negative for chest pain and palpitations.  Gastrointestinal: Negative for vomiting, abdominal pain and diarrhea.  Musculoskeletal: Negative for myalgias and arthralgias.  Skin: Negative for color change and rash.  Neurological: Positive for weakness. Negative for tremors, syncope and headaches.  Psychiatric/Behavioral: Positive for confusion. Negative for dysphoric mood.      Allergies  Hydrocodone  Home Medications   Prior to Admission medications   Not on File   BP 137/52 mmHg  Pulse 123  Temp(Src) 102.2 F (39 C) (Rectal)  Resp 22  Ht 5\' 8"  (1.727 m)  Wt 185 lb (83.915 kg)  BMI 28.14 kg/m2  SpO2 95% Physical Exam  Constitutional: He appears well-developed and well-nourished.  HENT:  Head: Normocephalic and atraumatic.  Eyes: EOM are normal. Pupils are equal, round, and reactive to light.  Neck: Normal range of motion. Neck supple. No JVD present.  Cardiovascular: Normal rate and regular rhythm.  Exam reveals no gallop and no friction rub.   No murmur heard. Pulmonary/Chest: No respiratory distress. He has no wheezes.  Abdominal: He exhibits no distension. There is no tenderness. There is no rebound and no guarding.  Musculoskeletal: Normal range of motion. He exhibits tenderness.  Erythema and warmth to the left lower leg. Pulse intact distally. Patient also with diffuse stage I ulceration to his sacrum and buttocks. Some mild stage II ulceration as well.  Neurological: He is alert.  Skin: No rash noted. No pallor.  Psychiatric: He has a normal mood and affect. His behavior is normal.  Nursing note and vitals reviewed.   ED Course   Procedures (including critical care time) Labs Review Labs Reviewed  COMPREHENSIVE METABOLIC PANEL - Abnormal; Notable for the following:    Chloride 99 (*)    Glucose, Bld 403 (*)    Calcium 8.6 (*)    Total Protein 6.4 (*)    Albumin 2.4 (*)    Anion gap 16 (*)    All other components within normal limits  URINALYSIS, ROUTINE W REFLEX MICROSCOPIC (NOT AT [facility]) - Abnormal; Notable for the following:    APPearance CLOUDY (*)    Glucose, UA >1000 (*)    Hgb urine dipstick TRACE (*)    Ketones, ur 40 (*)    Protein, ur 30 (*)    Leukocytes, UA SMALL (*)    All other components within normal limits  CBC - Abnormal; Notable for the following:    WBC 14.2 (*)    RBC 4.18 (*)    Hemoglobin 10.9 (*)    HCT 36.0 (*)    RDW 20.8 (*)    All other components within normal limits  URINE MICROSCOPIC-ADD ON - Abnormal; Notable for the following:    Bacteria, UA FEW (*)    All other components within normal limits  I-STAT CG4 LACTIC ACID, ED - Abnormal; Notable for the following:    Lactic Acid, Venous 6.66 (*)    All other components within normal limits  I-STAT CHEM 8, ED - Abnormal; Notable for the following:    Chloride 100 (*)    Glucose, Bld 401 (*)    Calcium, Ion 1.04 (*)    All other components within normal limits  CBG MONITORING, ED - Abnormal; Notable for the following:    Glucose-Capillary 367 (*)    All other components within normal limits  CBG MONITORING, ED - Abnormal; Notable for the following:    Glucose-Capillary 355 (*)    All other components within normal limits  URINE CULTURE  CULTURE, BLOOD (ROUTINE X 2)  CULTURE, BLOOD (ROUTINE X 2)  LIPASE, BLOOD  BLOOD GAS, ARTERIAL  I-STAT CG4 LACTIC ACID, ED    Imaging Review Dg Chest 2 View  04/12/15  CLINICAL DATA:  Altered mental status, fever, tachypnea, hypotension, tachycardia, decreased level of consciousness, cellulitis LEFT leg, baseline dementia and disorientation, diabetes mellitus, hypertension,  stroke, atrial fibrillation EXAM: CHEST  2 VIEW COMPARISON:  None FINDINGS: Elevation of RIGHT diaphragm. Normal heart size. Pulmonary vascular congestion. Elongation of thoracic aorta. Atelectasis in mid to lower RIGHT lung. Question LEFT nipple shadow. Minimal linear scarring or subsegmental atelectasis LEFT base. Remaining lungs clear. No gross pleural effusion or pneumothorax. Sclerosis at old lateral RIGHT rib fractures. Multiple prior spinal augmentation procedures at thoracolumbar spine. IMPRESSION: RIGHT basilar atelectasis with minimal atelectasis or scarring at LEFT base. Question LEFT nipple shadow; followup chest radiograph with nipple markers recommended to exclude pulmonary nodule. Electronically Signed   By: Ulyses Southward M.D.   On: 2015/04/12 13:34   Dg Tibia/fibula Left  04-12-2015  CLINICAL DATA:  Left lower extremity cellulitis. EXAM: LEFT TIBIA AND FIBULA - 2 VIEW COMPARISON:  None. FINDINGS: The knee and ankle joints are maintained. No acute bony findings or destructive bony changes. No gas is seen in the soft tissues. IMPRESSION: No acute bony findings. Electronically Signed   By: Rudie Meyer M.D.   On: 03/21/2015 13:23   Dg Foot Complete Left  03/26/2015  CLINICAL DATA:  Left lower leg and foot erythema and edema of uncertain duration in a diabetic patient. Initial encounter. EXAM: LEFT FOOT - COMPLETE 3+ VIEW COMPARISON:  None. FINDINGS: There is difficulty positioning the patient. The patient has a fracture through the midshaft of the distal phalanx of the left great toe. The fracture is minimally displaced. No other bony or joint abnormality is identified. No radiopaque foreign body or soft tissue gas collection is seen. IMPRESSION: Mildly displaced fracture distal phalanx of the left great toe. Given the patient's history, coexistent infection in this location is possible. Recommend clinical correlation for adjacent soft tissue wound. Electronically Signed   By: Drusilla Kanner  M.D.   On: 03/22/2015 13:24   I have personally reviewed and evaluated these images and lab results as part of my medical decision-making.   EKG Interpretation None      MDM   Final diagnoses:  Sepsis, due to unspecified organism [facility])    64 yo M with a chief complaints of altered mental status. Patient likely septic by initial vital signs. Hypotensive in the 70s heart rate into the 130s. Temperature 102. Code sepsis was initiated patient was given 30 mL/kg of IV fluid. Was given vancomycin and Zosyn. Chest x-ray unremarkable. UA negative for infection. Lactate of 6 Patient has significant erythema and warmth to the left lower leg. Suspect this may be the source of his infection. Blood pressure improved significantly with IV fluids. Patient is currently a DO NOT RESUSCITATE, consult the hospitalist for stepdown admission.  CRITICAL CARE Performed by: Rae Roam   Total critical care time: 75 minutes  Critical care time was exclusive of separately billable procedures and treating other patients.  Critical care was necessary to treat or prevent imminent or life-threatening deterioration.  Critical care was time spent personally by me on the following activities: development of treatment plan with patient and/or surrogate as well as nursing, discussions with consultants, evaluation of patient's response to treatment, examination of patient, obtaining history from patient or surrogate, ordering and performing treatments and interventions, ordering and review of laboratory studies, ordering and review of radiographic studies, pulse oximetry and re-evaluation of patient's condition.   The patients results and plan were reviewed and discussed.   Any x-rays performed were independently reviewed by myself.   Differential diagnosis were considered with the presenting HPI.  Medications  acetaminophen (TYLENOL) suppository 650 mg (not administered)  piperacillin-tazobactam (ZOSYN)  IVPB 3.375 g (0 g Intravenous Stopped 04/02/2015 1244)  sodium chloride 0.9 % bolus 1,000 mL (1,000 mLs Intravenous New Bag/Given 04/01/2015 1353)    Followed by  sodium chloride 0.9 % bolus 500 mL (0 mLs Intravenous Stopped 04/05/2015 1500)  vancomycin (VANCOCIN) 1,500 mg in sodium chloride 0.9 % 500 mL IVPB (1,500 mg Intravenous New Bag/Given 03/30/2015 1226)  morphine 2 MG/ML injection 2 mg (2 mg Intravenous Given 03/16/2015 1420)  ondansetron (ZOFRAN) injection 4 mg (4 mg Intravenous Given 03/17/2015 1420)    Filed Vitals:   04/01/2015 1415 03/20/2015 1430 03/25/2015 1445 03/27/2015 1459  BP: 133/60 142/53 137/52 137/52  Pulse: 134 125 124 123  Temp: 102.4 F (39.1 C) 102.4 F (39.1 C) 102.2 F (39  C) 102.2 F (39 C)  TempSrc:    Rectal  Resp: 38 Height:      Weight:      SpO2: 92% 98% 95% 95%    Final diagnoses:  Sepsis, due to unspecified organism [facility])    Admission/ observation were discussed with the admitting physician, patient and/or family and they are comfortable with the plan.    Melene Plan, DO 03-Apr-2015 [phone]

## 2015-03-28 NOTE — ED Notes (Signed)
Bed: WA18 Expected date:  Expected time:  Means of arrival:  Comments: Hold for Res A 

## 2015-03-28 NOTE — ED Notes (Signed)
Dr. Adela LankFloyd and RN notified of pt's Lactic Acid of 3.01

## 2015-03-29 DIAGNOSIS — A419 Sepsis, unspecified organism: Principal | ICD-10-CM

## 2015-03-29 DIAGNOSIS — M459 Ankylosing spondylitis of unspecified sites in spine: Secondary | ICD-10-CM

## 2015-03-29 DIAGNOSIS — G8929 Other chronic pain: Secondary | ICD-10-CM

## 2015-03-29 DIAGNOSIS — I482 Chronic atrial fibrillation: Secondary | ICD-10-CM

## 2015-03-29 DIAGNOSIS — L03116 Cellulitis of left lower limb: Secondary | ICD-10-CM

## 2015-03-29 DIAGNOSIS — G629 Polyneuropathy, unspecified: Secondary | ICD-10-CM

## 2015-03-29 DIAGNOSIS — N390 Urinary tract infection, site not specified: Secondary | ICD-10-CM

## 2015-03-29 LAB — BASIC METABOLIC PANEL
Anion gap: 12 (ref 5–15)
BUN: 19 mg/dL (ref 6–20)
CALCIUM: 7.3 mg/dL — AB (ref 8.9–10.3)
CO2: 22 mmol/L (ref 22–32)
Chloride: 108 mmol/L (ref 101–111)
Creatinine, Ser: 0.75 mg/dL (ref 0.61–1.24)
GFR calc Af Amer: >60 mL/min (ref >60)
GLUCOSE: 384 mg/dL — AB (ref 65–99)
Potassium: 4 mmol/L (ref 3.5–5.1)
Sodium: 142 mmol/L (ref 135–145)

## 2015-03-29 LAB — CBC
HCT: 29.1 % — ABNORMAL LOW (ref 39.0–52.0)
Hemoglobin: 8.7 g/dL — ABNORMAL LOW (ref 13.0–17.0)
MCH: 25.2 pg — AB (ref 26.0–34.0)
MCHC: 29.9 g/dL — ABNORMAL LOW (ref 30.0–36.0)
MCV: 84.3 fL (ref 78.0–100.0)
PLATELETS: 292 10*3/uL (ref 150–400)
RBC: 3.45 MIL/uL — ABNORMAL LOW (ref 4.22–5.81)
RDW: 20.9 % — AB (ref 11.5–15.5)
WBC: 9.8 10*3/uL (ref 4.0–10.5)

## 2015-03-29 LAB — GLUCOSE, CAPILLARY
Glucose-Capillary: 302 mg/dL — ABNORMAL HIGH (ref 65–99)
Glucose-Capillary: 309 mg/dL — ABNORMAL HIGH (ref 65–99)
Glucose-Capillary: 271 mg/dL — ABNORMAL HIGH (ref 65–99)

## 2015-03-29 LAB — LACTIC ACID, PLASMA: Lactic Acid, Venous: 3.6 mmol/L (ref 0.5–2.0)

## 2015-03-29 LAB — CBG MONITORING, ED: Glucose-Capillary: 334 mg/dL — ABNORMAL HIGH (ref 65–99)

## 2015-03-29 MED ORDER — INSULIN GLARGINE 100 UNIT/ML ~~LOC~~ SOLN
5.0000 [IU] | Freq: Every day | SUBCUTANEOUS | Status: DC
  Administered 2015-03-29 – 2015-03-30 (×2): 5 [IU] via SUBCUTANEOUS
  Filled 2015-03-29 (×3): qty 0.05

## 2015-03-29 MED ORDER — BARRIER CREAM NON-SPECIFIED
1.0000 "application " | TOPICAL_CREAM | Freq: Three times a day (TID) | TOPICAL | Status: DC | PRN
  Filled 2015-03-29: qty 1

## 2015-03-29 NOTE — ED Notes (Addendum)
hospitalist madera at bedside, md made aware pt having difficulty swallowing.   POA at bedside

## 2015-03-29 NOTE — Progress Notes (Signed)
Spoke with Dr Gwenlyn PerkingMadera pt to be acute vs sdu

## 2015-03-29 NOTE — ED Notes (Signed)
Rn spoke with pt and family to confirm about vasopressors. Family was discussing this family early this morning.   Per patient and family pt does not want a central line for any reason and does not want pressors used as medication. Do not intubate or CPR.

## 2015-03-29 NOTE — Progress Notes (Signed)
[facility] PROGRESS NOTE  Donald Flynn ZOX:[id number] DOB: 01/24/1951 DOA: 21-Aug-2014 PCP: Florentina JennyRIPP, HENRY, MD  Assessment/Plan: 1-Sepsis: In setting of LE cellulitis and UTI.  -Patient presented with lactic acid at 6.6, Hypotensive SBP in the 80, WBC's 14.2, febrile and tachycardic.  -good response to IVF's and IV antibiotics; BP is now stable, patient WBC's 9.8, lactic acid improved to 3.6 and he is not febrile or tachycardic -Patient does not want IV pressors or any other artificial intervention.  -Continue with IV fluids, IV antibiotics (Vancomycin and Zosyn); follow clinical response and follow rec's from palliative team; patient with very poor quality of life at baseline.  -will also continue stress dose of steroids  -will repeat lactic acid in am  2-Diabetes, Uncontrolled:  -will continue SSI and add low dose lantus  3-Acute hypoxic respiratory Failure;  -do to number one -improved -will continue PRN O2 supplementation  -continue Nebulizer PRN.  -no tapchyneic   4-Chronic pain/neuropathy: Palliative care consulted for symptoms management.  -will continue dilaudid drip and PRN IV dilaudid for breakthrough  -continue lyrica   5-chronic A fib:  -continue eliquis for now -CHADVASC score 5 -Holding atenolol given hypotension on admission -HR iscontrolled  6-Left LE cellulitis;  -continue IV antibiotics.   7-Anemia of Chronic disease: no signs of active bleeding  -will monitor  8-Ankylosing spondylitis: -chronic and debilitating  -steadily declining and currently bed bound -continue pain meds   Code Status: DNR/DNI Family Communication: Son Youth worker(HCPOA) and sister at bedside  Disposition Plan: to be determine; active /steady declining since 2013; do not want any artificial support; palliative care helping with Goals of care and symptomatic management    Consultants:  Palliative Care   Procedures:  See below for x-ray reports   Antibiotics:  Vancomycin  12/13  Zosyn 12/13  HPI/Subjective: Afebrile, stable BP and denying CP. Patient is confused and with complaints of ongoing pain on his legs and lower back. ER nurse reported some difficulty swallowing and followingespecificcommands  Objective: Filed Vitals:   03/29/15 1000 03/29/15 1020  BP: 152/74 145/65  Pulse: 99 94  Temp: 98.6 F (37 C) 98.8 F (37.1 C)  Resp: 18 17    Intake/Output Summary (Last 24 hours) at 03/29/15 1038 Last data filed at 03/29/15 0912  Gross per 24 hour  Intake   4000 ml  Output   1000 ml  Net   3000 ml   Filed Weights   08-28-14 1219  Weight: 83.915 kg (185 lb)    Exam:   General:  Afebrile, confused and with main complaints of pain in his lower back and legs  Cardiovascular: S1 and S2, no rubs or gallops  Respiratory: no wheezing, no rales; slight tachypnea appreciated on exam  Abdomen: soft, NT, ND, positive BS  Musculoskeletal: trace edema bilaterally, scattered scabs on both legs appreciated; left LE with erythema, warm sensation and pain on palpation   Data Reviewed: Basic Metabolic Panel:  Recent Labs Lab 08-28-14 1152 08-28-14 1228 03/29/15 0605  NA 140 139 142  K 3.8 3.7 4.0  CL 99* 100* 108  CO2 25  --  22  GLUCOSE 403* 401* 384*  BUN 15 15 19   CREATININE 1.11 0.90 0.75  CALCIUM 8.6*  --  7.3*   Liver Function Tests:  Recent Labs Lab 08-28-14 1152  AST 24  ALT 18  ALKPHOS 106  BILITOT 0.8  PROT 6.4*  ALBUMIN 2.4*    Recent Labs Lab 08-28-14 1204  LIPASE 20   CBC:  Recent Labs Lab Apr 05, 2015 1152 04/05/2015 1228 03/29/15 0605  WBC 14.2*  --  9.8  HGB 10.9* 13.6 8.7*  HCT 36.0* 40.0 29.1*  MCV 86.1  --  84.3  PLT 299  --  292   CBG:  Recent Labs Lab 04-05-15 1335 2015/04/05 1651 04-05-2015 1940 Apr 05, 2015 2341 03/29/15 0711  GLUCAP 355* 359* 380* 318* 334*    Recent Results (from the past 240 hour(s))  Urine culture     Status: None (Preliminary result)   Collection Time: 2015/04/05 12:28 PM   Result Value Ref Range Status   Specimen Description URINE, CLEAN CATCH  Final   Special Requests NONE  Final   Culture   Final    TOO YOUNG TO READ Performed at [facility]    Report Status PENDING  Incomplete     Studies: Dg Chest 2 View  04-05-15  CLINICAL DATA:  Altered mental status, fever, tachypnea, hypotension, tachycardia, decreased level of consciousness, cellulitis LEFT leg, baseline dementia and disorientation, diabetes mellitus, hypertension, stroke, atrial fibrillation EXAM: CHEST  2 VIEW COMPARISON:  None FINDINGS: Elevation of RIGHT diaphragm. Normal heart size. Pulmonary vascular congestion. Elongation of thoracic aorta. Atelectasis in mid to lower RIGHT lung. Question LEFT nipple shadow. Minimal linear scarring or subsegmental atelectasis LEFT base. Remaining lungs clear. No gross pleural effusion or pneumothorax. Sclerosis at old lateral RIGHT rib fractures. Multiple prior spinal augmentation procedures at thoracolumbar spine. IMPRESSION: RIGHT basilar atelectasis with minimal atelectasis or scarring at LEFT base. Question LEFT nipple shadow; followup chest radiograph with nipple markers recommended to exclude pulmonary nodule. Electronically Signed   By: Ulyses Southward M.D.   On: April 05, 2015 13:34   Dg Tibia/fibula Left  04-05-2015  CLINICAL DATA:  Left lower extremity cellulitis. EXAM: LEFT TIBIA AND FIBULA - 2 VIEW COMPARISON:  None. FINDINGS: The knee and ankle joints are maintained. No acute bony findings or destructive bony changes. No gas is seen in the soft tissues. IMPRESSION: No acute bony findings. Electronically Signed   By: Rudie Meyer M.D.   On: 04/05/2015 13:23   Dg Foot Complete Left  April 05, 2015  CLINICAL DATA:  Left lower leg and foot erythema and edema of uncertain duration in a diabetic patient. Initial encounter. EXAM: LEFT FOOT - COMPLETE 3+ VIEW COMPARISON:  None. FINDINGS: There is difficulty positioning the patient. The patient has a fracture  through the midshaft of the distal phalanx of the left great toe. The fracture is minimally displaced. No other bony or joint abnormality is identified. No radiopaque foreign body or soft tissue gas collection is seen. IMPRESSION: Mildly displaced fracture distal phalanx of the left great toe. Given the patient's history, coexistent infection in this location is possible. Recommend clinical correlation for adjacent soft tissue wound. Electronically Signed   By: Drusilla Kanner M.D.   On: 04-05-15 13:24    Scheduled Meds: . apixaban  5 mg Oral BID  . hydrocortisone sod succinate (SOLU-CORTEF) inj  100 mg Intravenous Q8H  . insulin aspart  0-9 Units Subcutaneous TID WC   Continuous Infusions: . sodium chloride 125 mL/hr at Apr 05, 2015 1925  . HYDROmorphone 0.25 mg/hr (04-05-2015 1944)  . piperacillin-tazobactam (ZOSYN)  IV Stopped (03/29/15 0521)  . vancomycin Stopped (03/29/15 1610)     Time spent: 35 minutes (>50 % of the time dedicated to face to face examination, provide plan of care and updates to family at bedside)    Vassie Loll  [facility] Pager [phone]. If 7PM-7AM, please contact night-coverage  at [email or web address], password [facility] 03/29/2015, 10:38 AM  LOS: 1 day

## 2015-03-29 NOTE — ED Notes (Addendum)
Unsure how to stage,  stage I pressure ulcer?/ bright redness to all of sacrum/buttocks area. Unsure if white areas that are present are skin flaking or yeast. Redness extends down bilateral legs, estimated 3 inches below buttocks, redness not touching or connected to redness from leg cellulitis.

## 2015-03-29 NOTE — ED Notes (Signed)
Family at bedside. 

## 2015-03-29 NOTE — Progress Notes (Signed)
Inpatient Diabetes Program Recommendations  AACE/ADA: New Consensus Statement on Inpatient Glycemic Control (2015)  Target Ranges:  Prepandial:   less than 140 mg/dL      Peak postprandial:   less than 180 mg/dL (1-2 hours)      Critically ill patients:  140 - 180 mg/dL   Review of Glycemic Control  Diabetes history: DM2 Outpatient Diabetes medications: Levemir 28 units QHS, Novolin R 6 units bid Current orders for Inpatient glycemic control: Lantus 5 units QHS, Novolog sensitive tidwc  Results for Donald Flynn, Donald Flynn (MRN [id number]) as of 03/29/2015 17:26  Ref. Range 04/04/2015 19:40 03/17/2015 23:41 03/29/2015 07:11 03/29/2015 12:48 03/29/2015 16:35  Glucose-Capillary Latest Ref Range: 65-99 mg/dL 380 (H) 318 (H) 334 (H) 302 (H) 309 (H)  Results for ORLYN, ODONOGHUE (MRN [id number]) as of 03/29/2015 17:26  Ref. Range 03/29/2015 06:05  Sodium Latest Ref Range: 135-145 mmol/L 142  Potassium Latest Ref Range: 3.5-5.1 mmol/L 4.0  Chloride Latest Ref Range: 101-111 mmol/L 108  CO2 Latest Ref Range: 22-32 mmol/L 22  BUN Latest Ref Range: 6-20 mg/dL 19  Creatinine Latest Ref Range: 0.61-1.24 mg/dL 0.75  Calcium Latest Ref Range: 8.9-10.3 mg/dL 7.3 (L)  EGFR (Non-African Amer.) Latest Ref Range: >60 mL/min >60  EGFR (African American) Latest Ref Range: >60 mL/min >60  Glucose Latest Ref Range: 65-99 mg/dL 384 (H)  Anion gap Latest Ref Range: 5-15  12   Inpatient Diabetes Program Recommendations:    D/C Lantus. Add Levemir 15 units QHS Add Novolog 4 units tidwc for meal coverage insulin when diet is advanced. HgbA1C would not be accurate with low H/H.  Will follow daily. Thank you. Lorenda Peck, RD, LDN, CDE Inpatient Diabetes Coordinator [phone]

## 2015-03-29 NOTE — ED Notes (Signed)
Report given to David, RN.

## 2015-03-29 NOTE — Consult Note (Signed)
WOC wound consult note Reason for Consult: Bilateral buttocks with MASD, specifically Incontinence associated dermatitis (IAD-severe) and intertriginous dermatitis of the bilateral inguinal areas.  No pressure injuries, heels are at high risk Wound type:Moisture associated skin damage Pressure Ulcer POA: No Measurement: Entire buttocks including gluteal cleft.  Bilateral inguinal skin folds. Wound RUE:AVWU/bed:none/ Scattered pinpoint openings consistent with IAD Drainage (amount, consistency, odor) scant serous Periwound:erythematous Dressing procedure/placement/frequency:A therapeutic mattress with low air loss feature is provided as are bilateral pressure redistribution heel boots for the feet.  Our house antimicrobial textile will be provided for the bilateral inguinal areas and a moisture barrier ointment for the buttocks will assist with tissue repair and regeneration.  Elevation of the left arm in particular will be recommended as it is edematous today. WOC nursing team will not follow, but will remain available to this patient, the nursing and medical teams.  Please re-consult if needed. Thanks, Ladona MowLaurie Able Malloy, MSN, RN, GNP, Hans EdenCWOCN, CWON-AP, FAAN  Pager# [phone]

## 2015-03-29 NOTE — ED Notes (Signed)
Pt requesting water, rn attempted to have pt drink water from a straw. Pt unable to follow commands to drink water via straw. Pt tried several times. Pt getting water via mouth swab, which pt calls a "dropper".

## 2015-03-30 DIAGNOSIS — R1310 Dysphagia, unspecified: Secondary | ICD-10-CM

## 2015-03-30 DIAGNOSIS — L8990 Pressure ulcer of unspecified site, unspecified stage: Secondary | ICD-10-CM

## 2015-03-30 DIAGNOSIS — G894 Chronic pain syndrome: Secondary | ICD-10-CM

## 2015-03-30 LAB — GLUCOSE, CAPILLARY
GLUCOSE-CAPILLARY: 343 mg/dL — AB (ref 65–99)
GLUCOSE-CAPILLARY: 288 mg/dL — AB (ref 65–99)

## 2015-03-30 LAB — URINE CULTURE

## 2015-03-30 LAB — LACTIC ACID, PLASMA: Lactic Acid, Venous: 1.1 mmol/L (ref 0.5–2.0)

## 2015-03-30 MED ORDER — POLYVINYL ALCOHOL 1.4 % OP SOLN
1.0000 [drp] | Freq: Four times a day (QID) | OPHTHALMIC | Status: DC | PRN
  Filled 2015-03-30: qty 15

## 2015-03-30 MED ORDER — GLYCOPYRROLATE 0.2 MG/ML IJ SOLN
0.2000 mg | INTRAMUSCULAR | Status: DC | PRN
  Filled 2015-03-30: qty 1

## 2015-03-30 MED ORDER — ONDANSETRON 4 MG PO TBDP: 4.0000 mg | ORAL_TABLET | Freq: Four times a day (QID) | ORAL | Status: DC | PRN

## 2015-03-30 MED ORDER — GLYCOPYRROLATE 1 MG PO TABS
1.0000 mg | ORAL_TABLET | ORAL | Status: DC | PRN
  Filled 2015-03-30: qty 1

## 2015-03-30 MED ORDER — BIOTENE DRY MOUTH MT LIQD: 15.0000 mL | OROMUCOSAL | Status: DC | PRN

## 2015-03-30 MED ORDER — ONDANSETRON HCL 4 MG/2ML IJ SOLN: 4.0000 mg | Freq: Four times a day (QID) | INTRAMUSCULAR | Status: DC | PRN

## 2015-03-30 MED ORDER — LORAZEPAM 2 MG/ML IJ SOLN
1.0000 mg | Freq: Once | INTRAMUSCULAR | Status: AC
  Administered 2015-03-30: 1 mg via INTRAVENOUS
  Filled 2015-03-30: qty 1

## 2015-03-30 MED ORDER — LORAZEPAM 1 MG PO TABS: 1.0000 mg | ORAL_TABLET | ORAL | Status: DC | PRN

## 2015-03-30 MED ORDER — HALOPERIDOL LACTATE 2 MG/ML PO CONC
0.5000 mg | ORAL | Status: DC | PRN
  Filled 2015-03-30: qty 0.3

## 2015-03-30 MED ORDER — HALOPERIDOL 0.5 MG PO TABS
0.5000 mg | ORAL_TABLET | ORAL | Status: DC | PRN
  Filled 2015-03-30: qty 1

## 2015-03-30 MED ORDER — LORAZEPAM 2 MG/ML PO CONC: 1.0000 mg | ORAL | Status: DC | PRN

## 2015-03-30 MED ORDER — HALOPERIDOL LACTATE 5 MG/ML IJ SOLN: 0.5000 mg | INTRAMUSCULAR | Status: DC | PRN

## 2015-03-30 MED ORDER — HYDROCORTISONE NA SUCCINATE PF 100 MG IJ SOLR
50.0000 mg | Freq: Two times a day (BID) | INTRAMUSCULAR | Status: DC
  Administered 2015-03-30 – 2015-03-31 (×2): 50 mg via INTRAVENOUS
  Filled 2015-03-30 (×5): qty 1

## 2015-03-30 MED ORDER — BISACODYL 10 MG RE SUPP: 10.0000 mg | Freq: Every day | RECTAL | Status: DC | PRN

## 2015-03-30 MED ORDER — LORAZEPAM 2 MG/ML IJ SOLN
1.0000 mg | INTRAMUSCULAR | Status: DC | PRN
  Administered 2015-03-30 – 2015-03-31 (×5): 1 mg via INTRAVENOUS
  Filled 2015-03-30 (×5): qty 1

## 2015-03-30 NOTE — Progress Notes (Signed)
NUTRITION NOTE  Pt seen for low Braden assessment. Spoke with family member, who is at bedside. She states discussion was had with MD shortly before RD visit and the plan is to focus on comfort and making sure the pt is as comfortable and happy as possible. She states that pt is unable to drink from a straw. Family member states that the goal will be to try to minimize risk of aspiration but that if it occurs then "we will treat it. As long as he is comfortable he can have whatever he would like." Family member requests orange juice for pt; RD provided her with 2 cups of orange juice to give to pt as desired.    Family member denies any other nutrition-related needs at this time. Informed her to let RN or MD know if she needs anything further and RD will re-visit. No nutrition interventions warranted at this time. Labs and medications reviewed.   Trenton GammonJessica Karly Pitter, RD, LDN Inpatient Clinical Dietitian Pager # [phone] After hours/weekend pager # [phone]

## 2015-03-30 NOTE — Progress Notes (Signed)
Daily Progress Note   Patient Name: Donald Flynn       Date: 03/30/2015 DOB: 1950/08/15  Age: 64 y.o. MRN#: [id number] Attending Physician: Vassie Loll, MD Primary Care Physician: Florentina Jenny, MD Admit Date: 03/20/2015  Reason for Consultation/Follow-up: Disposition, Hospice Evaluation, Inpatient hospice referral, Pain control, Psychosocial/spiritual support and Terminal Care  Subjective: More alert today but agitated and confused. Answers some questions appropriately. 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 Today he told me he wanted me to "do what I should do" and that he was tired of being asked the same questions- his agitation escalated. Pain control adequate, still needed boluses and family concerned about worsening dyspnea. He has had minimal to no PO intake.  Interval Events: Admitted with sepsis due to LE cellulitis- 12/13 Length of Stay: 2 days  Current Medications: Scheduled Meds:  . hydrocortisone sod succinate (SOLU-CORTEF) inj  50 mg Intravenous Q12H  . insulin glargine  5 Units Subcutaneous Daily  . piperacillin-tazobactam (ZOSYN)  IV  3.375 g Intravenous Q8H  . vancomycin  1,000 mg Intravenous Q8H    Continuous Infusions: . sodium chloride 50 mL/hr at 03/29/15 1723  . HYDROmorphone 0.5 mg/hr (03/30/15 0759)    PRN Meds: acetaminophen **OR** acetaminophen, albuterol, antiseptic oral rinse, barrier cream, bisacodyl, glycopyrrolate **OR** glycopyrrolate **OR** glycopyrrolate, haloperidol **OR** haloperidol **OR** haloperidol lactate,  HYDROmorphone, ipratropium-albuterol, LORazepam **OR** LORazepam **OR** LORazepam, ondansetron **OR** ondansetron (ZOFRAN) IV, ondansetron **OR** [DISCONTINUED] ondansetron (ZOFRAN) IV, polyvinyl alcohol  Physical Exam: Physical Exam              Vital Signs: BP 155/65 mmHg  Pulse 91  Temp(Src) 97.9 F (36.6 C) (Oral)  Resp 18  Ht  (1.6 m)  Wt 88.451 kg (195 lb)  BMI 34.55 kg/m2  SpO2 95% SpO2: SpO2: 95 % O2 Device: O2 Device: Nasal Cannula O2 Flow Rate: O2 Flow Rate (L/min): 4 L/min  Intake/output summary:  Intake/Output Summary (Last 24 hours) at 03/30/15 1325 Last data filed at 03/30/15 0605  Gross per 24 hour  Intake    500 ml  Output   1300 ml  Net   -800 ml   LBM: Last BM Date: 03/27/15 Baseline Weight: Weight: 83.915 kg (185 lb) Most recent weight: Weight: 88.451 kg (195 lb)       Palliative Assessment/Data: Flowsheet Rows        Most Recent Value   Intake Tab    Referral Department  Hospitalist   Unit at Time of Referral  ER   Palliative Care Primary Diagnosis  Sepsis/Infectious Disease   Date Notified  Apr 18, 2015   Palliative Care Type  New Palliative care   Reason for referral  Pain, Clarify Goals of Care   Date of Admission  2015/04/18   Date first seen by Palliative Care  04/18/2015   # of days Palliative referral response time  0 Day(s)   # of days IP prior to Palliative referral  0   Clinical Assessment    Palliative Performance Scale Score  20%   Pain Max last 24 hours  10   Pain Min Last 24 hours  10   Dyspnea Max Last 24 Hours  5   Dyspnea Min Last 24 hours  5   Nausea Max Last 24 Hours  Not able to report   Nausea Min Last 24 Hours  Not able to report   Anxiety Max Last 24 Hours  Not able to report   Anxiety Min Last 24 Hours  Not able to report   Other Max Last 24 Hours  Not able to report   Psychosocial & Spiritual Assessment    Palliative Care Outcomes    Patient/Family meeting held?  No   Palliative Care Outcomes  Clarified goals of care    Patient/Family wishes: Interventions discontinued/not started   Mechanical Ventilation, Vasopressors, BiPAP, NIPPV   Palliative Care follow-up planned  Yes, Facility      Additional Data Reviewed: CBC    Component Value Date/Time   WBC 9.8 03/29/2015 0605   RBC 3.45* 03/29/2015 0605   HGB 8.7* 03/29/2015 0605   HCT 29.1* 03/29/2015 0605   PLT 292 03/29/2015 0605   MCV 84.3 03/29/2015 0605   MCH 25.2* 03/29/2015 0605   MCHC 29.9* 03/29/2015 0605   RDW 20.9* 03/29/2015 0605    CMP     Component Value Date/Time   NA 142 03/29/2015 0605   K 4.0 03/29/2015 0605   CL 108 03/29/2015 0605   CO2 22 03/29/2015 0605   GLUCOSE 384* 03/29/2015 0605   BUN 19 03/29/2015 0605   CREATININE 0.75 03/29/2015 0605   CALCIUM 7.3* 03/29/2015 0605   PROT 6.4* 04/18/2015 1152   ALBUMIN 2.4* 04-18-2015 1152   AST 24 18-Apr-2015 1152   ALT 18 04-18-2015 1152   ALKPHOS 106 04-18-15 1152   BILITOT 0.8 04-18-15 1152   GFRNONAA >60 03/29/2015 0605   GFRAA >60 03/29/2015 0605       Problem List:  Patient Active Problem List   Diagnosis Date Noted  . Sepsis (HCC) 2015/04/18  . UTI (lower urinary tract infection) 04-18-15  . Cellulitis of lower extremity 04-18-2015  . Diabetes (HCC) 18-Apr-2015  . Decubitus skin ulcer Apr 18, 2015     Palliative Care Assessment & Plan    1.Code Status:  DNR    Code Status Orders        Start     Ordered   03/30/15 1322  Do not attempt resuscitation (DNR)   Continuous    Question Answer Comment  In the event of cardiac or respiratory ARREST  Do not call a "code blue"   In the event of cardiac or respiratory ARREST Do not perform Intubation, CPR, defibrillation or ACLS   In the event of cardiac or respiratory ARREST Use medication by any route, position, wound care, and other measures to relive pain and suffering. May use oxygen, suction and manual treatment of airway obstruction as needed for comfort.      03/30/15 1322       2. Goals  of Care/Additional Recommendations:  FULL COMFORT CARE  Limitations on Scope of Treatment: Full Comfort Care  Desire for further Chaplaincy support:yes  Psycho-social Needs: Grief/Bereavement Support  3. Symptom Management:      1.Continue Dilaudid infusion, Add on Ativan and haldol along with Palliative order set and prophylaxis  4. Palliative Prophylaxis:   Aspiration, Bowel Regimen, Delirium Protocol and Frequent Pain Assessment  5. Prognosis: < 2 weeks  6. Discharge Planning:  Hospice facility   Care plan was discussed with RN, Family, Patient.  Thank you for allowing the Palliative Medicine Team to assist in the care of this patient.   Time In: 12:15 Time Out: 1:30 Total Time 75 min Prolonged Time Billed yes    This is a thoughtful and intelligent family who have come together collectively to help Meiko approach EOL with comfort and dignity, they are indeed accepting of this but also appropriately nervous and need lots of reassurance that this is where we are in his illness- he had a very poor functional status prior to admission and poorly controlled chronic pain. I spent teh majority of visit discussing possible trajectories of his illness. He has received 3 days of IV antibiotics now and his cellulitis has improved-he however as a whole looks much worse and his agitation and confusion are persistent.      Edsel PetrinElizabeth L Dylin Ihnen, DO  03/30/2015, 1:25 PM  Please contact Palliative Medicine Team phone at [phone] for questions and concerns.

## 2015-03-30 NOTE — Progress Notes (Signed)
Facility contacted CSW to request an update. CSW provided update,   CSW to continue to follow for inpt support and d/c planning if needed.    Leron Croakassandra Tamara Monteith [facility]  [facility]  [phone]

## 2015-03-30 NOTE — Clinical Social Work Note (Signed)
Clinical Social Work Assessment  Patient Details  Name: Donald Flynn MRN: [id number] Date of Birth: [birth year]  Date of referral:  03/30/15               Reason for consult:  Grief and Loss, End of Life/Hospice                Permission sought to share information with:  Facility Sport and exercise psychologist, [place], Family Supports (HCPOA:    [name]     [phone]) Permission granted to share information::  Yes, Verbal Permission Granted  Name::     HCPOA:    [name]     [phone]  Agency::  Residential Hospice Facilities   Relationship::  Son   Contact Information:  HCPOA:    [name]     [phone]  Housing/Transportation Living arrangements for the past 2 months:  [place], [place] (Changed to ALF 2 weeks ago) Source of Information:  Other (Comment Required) (Sister and brother) Patient Interpreter Needed:  None Criminal Activity/Legal Involvement Pertinent to Current Situation/Hospitalization:  No - Comment as needed Significant Relationships:  Siblings Lives with:  Facility Resident Do you feel safe going back to the place where you live?  No Need for family participation in patient care:  Yes (Comment) (Pt is unresponsive (intermittently) HCPOA:    [name]     [phone])  Care giving concerns: Pt's family is concerned about Pt not being close to his family if he were to go to a facility further from [facility] location. Pt's sister has MS and has limited traveling capacity.   Social Worker assessment / plan:  CSW met with the family and Pt at the bedside. Pt was unable to participate with this assessment. CSW introduced self to Pt's family. Pt's sister [name] gave a brief explanation of Pt's past medical history and recent ALF placement. Pt's sister stated the Pt was living in [place] prior to two weeks ago. Pt has experienced several months of illnesses that have caused the patient to depend more on the family and  the Pt realized he would need to be closer to home. Pt became a widower 2 years ago and Pt's sister feels that is when the Pt's health became worse. Subsequently the Pt moved to [facility]F. Pt was only there for two weeks and was found unconscious. Pt's sister stated that Pt has been able to speak with the staff on a limited basis. Pt's sister and brother's stated that Pt's wishes for end of life were clear and the family would like to honor Pts wishes. CSW received referral for residential hospice and provided the family with a listing of facilities. CSW explained to the family that spaces are limited at this time and several facilities would need to be chosen in order to provide the best care to the Pt. Pt's family were in agreement to choosing multiple facilities. Families choices are: 1. [facility]   2. [facility]    3. .  CSW referred the Pt to [facility] and will refer the Pt's to the other two facilities in the morning.      Employment status:  Disabled (Comment on whether or not currently receiving Disability) Insurance information:  Medicare, Managed Medicare PT Recommendations:  Not assessed at this time Information / Referral to community resources:  Other (Comment Required) (Residential Hospice)  Patient/Family's Response to care:  Pt's family expressed gratefulness for CSW visit and explanation of residential  placement to hospice home.    Patient/Family's Understanding of and Emotional Response to Diagnosis, Current Treatment, and Prognosis: Pt's family are understanding of the death and dying process and are open to discussion of Pt's status.    Emotional Assessment Appearance:  Appears older than stated age Attitude/Demeanor/Rapport:  Unable to Assess Affect (typically observed):  Unable to Assess Orientation:   (Not alert or oriented) Alcohol / Substance use:  Not Applicable Psych involvement (Current and /or in the community):  No  (Comment)  Discharge Needs  Concerns to be addressed:  Discharge Planning Concerns, Decision making concerns, Adjustment to Illness, Grief and Loss Concerns (CSW providing support for the family and assistance with d/c planning to  Residential Hospice facility ) Readmission within the last 30 days:  No Current discharge risk:  Terminally ill Barriers to Discharge:  Hospice Bed not available   [name] 03/30/2015, 4:36 PM

## 2015-03-30 NOTE — Progress Notes (Addendum)
[facility] PROGRESS NOTE  Donald AllanJoe Flynn ZOX:[id number] DOB: 05/24/1950 DOA: 2014-06-19 PCP: Florentina JennyRIPP, HENRY, MD  Assessment/Plan: 1-Sepsis: In setting of LE cellulitis and UTI.  -Patient presented with lactic acid at 6.6, Hypotensive SBP in the 80, WBC's 14.2, febrile and tachycardic.  -good response to IVF's and IV antibiotics; BP is now stable, patient WBC's 9.8, lactic acid improved to 1.1 and he is not febrile or tachycardic at this moment. -Patient does not want IV pressors or any other artificial intervention.  -Continue with IV fluids (rate adjusted), IV antibiotics (Vancomycin and Zosyn); follow clinical response and follow rec's from palliative team; patient with very poor quality of life at baseline. Might benefit of full comfort and hospice care if appropriate/eligible candidate. -will also continue stress dose of steroids   2-Diabetes, Uncontrolled:  -will continue SSI and add low dose lantus  3-Acute hypoxic respiratory Failure;  -do to number one -improved -will continue PRN O2 supplementation  -continue Nebulizer PRN.  -no tapchyneic   4-Chronic pain/neuropathy: Palliative care consulted for symptoms management.  -will continue dilaudid drip and PRN IV dilaudid for breakthrough  -continue lyrica   5-chronic A fib:  -continue eliquis for now -CHADVASC score 5 -Holding atenolol given hypotension on admission and subsequent difficulty swallowing  -HR iscontrolled  6-Left LE cellulitis;  -continue IV antibiotics; plan of care to be decided today  7-Anemia of Chronic disease: no signs of active bleeding  -will monitor  8-Ankylosing spondylitis: -chronic and debilitating  -steadily declining and currently bed bound -continue pain meds (mange/adjusted by Palliative Care service)   9-pressure ulcer: -will follow WOC rec's  Code Status: DNR/DNI Family Communication: Sister at bedside  Disposition Plan: to be determine; active /steady declining since 2013;  do not want any artificial support; palliative care helping with Goals of care and symptomatic management    Consultants:  Palliative Care   Procedures:  See below for x-ray reports   Antibiotics:  Vancomycin 12/13  Zosyn 12/13  HPI/Subjective: Afebrile, stable BP and denying CP. Patient is confused and with some difficulty safely swalloing food and meds. Still with pain on his back and legs, but better.   Objective: Filed Vitals:   03/29/15 2300 03/30/15 0604  BP: 152/71 155/65  Pulse: 88 91  Temp: 97.9 F (36.6 C) 97.9 F (36.6 C)  Resp: 18 18    Intake/Output Summary (Last 24 hours) at 03/30/15 1134 Last data filed at 03/30/15 [id number]  Gross per 24 hour  Intake    500 ml  Output   1600 ml  Net  -1100 ml   Filed Weights   08/27/14 1219 03/30/15 0500  Weight: 83.915 kg (185 lb) 88.451 kg (195 lb)    Exam:   General:  Afebrile, calmer, but intermittently confused and with some difficulty swallowing. Reports pain is ok with current regimen. On Dilaudid drip and also dilaudid PRN  Cardiovascular: S1 and S2, no rubs or gallops  Respiratory: mild exp wheezing, no rales; no use of accessory muscles   Abdomen: soft, NT, ND, positive BS  Musculoskeletal: trace edema bilaterally, scattered scabs on both legs appreciated; left LE with improved erythema, still warm to touch and painful on palpation   Data Reviewed: Basic Metabolic Panel:  Recent Labs Lab 08/27/14 1152 08/27/14 1228 03/29/15 0605  NA 140 139 142  K 3.8 3.7 4.0  CL 99* 100* 108  CO2 25  --  22  GLUCOSE 403* 401* 384*  BUN 15 15 19   CREATININE 1.11 0.90 0.75  CALCIUM 8.6*  --  7.3*   Liver Function Tests:  Recent Labs Lab 04/06/2015 1152  AST 24  ALT 18  ALKPHOS 106  BILITOT 0.8  PROT 6.4*  ALBUMIN 2.4*    Recent Labs Lab 03/27/2015 1204  LIPASE 20   CBC:  Recent Labs Lab 04/12/2015 1152 04/13/2015 1228 03/29/15 0605  WBC 14.2*  --  9.8  HGB 10.9* 13.6 8.7*  HCT 36.0* 40.0  29.1*  MCV 86.1  --  84.3  PLT 299  --  292   CBG:  Recent Labs Lab 03/29/15 0711 03/29/15 1248 03/29/15 1635 03/29/15 2255 03/30/15 0744  GLUCAP 334* 302* 309* 271* 343*    Recent Results (from the past 240 hour(s))  Blood Culture (routine x 2)     Status: None (Preliminary result)   Collection Time: 04/03/2015 12:05 PM  Result Value Ref Range Status   Specimen Description BLOOD LEFT HAND  Final   Special Requests IN PEDIATRIC BOTTLE  Final   Culture   Final    NO GROWTH < 24 HOURS Performed at [facility]    Report Status PENDING  Incomplete  Blood Culture (routine x 2)     Status: None (Preliminary result)   Collection Time: 03/29/2015 12:20 PM  Result Value Ref Range Status   Specimen Description BLOOD RIGHT ANTECUBITAL  Final   Special Requests IN PEDIATRIC BOTTLE  Final   Culture   Final    NO GROWTH < 24 HOURS Performed at [facility]    Report Status PENDING  Incomplete  Urine culture     Status: None   Collection Time: 03/19/2015 12:28 PM  Result Value Ref Range Status   Specimen Description URINE, CLEAN CATCH  Final   Special Requests NONE  Final   Culture   Final    MULTIPLE SPECIES PRESENT, SUGGEST RECOLLECTION Performed at [facility]    Report Status 03/30/2015 FINAL  Final     Studies: Dg Chest 2 View  03/19/2015  CLINICAL DATA:  Altered mental status, fever, tachypnea, hypotension, tachycardia, decreased level of consciousness, cellulitis LEFT leg, baseline dementia and disorientation, diabetes mellitus, hypertension, stroke, atrial fibrillation EXAM: CHEST  2 VIEW COMPARISON:  None FINDINGS: Elevation of RIGHT diaphragm. Normal heart size. Pulmonary vascular congestion. Elongation of thoracic aorta. Atelectasis in mid to lower RIGHT lung. Question LEFT nipple shadow. Minimal linear scarring or subsegmental atelectasis LEFT base. Remaining lungs clear. No gross pleural effusion or pneumothorax. Sclerosis at old lateral  RIGHT rib fractures. Multiple prior spinal augmentation procedures at thoracolumbar spine. IMPRESSION: RIGHT basilar atelectasis with minimal atelectasis or scarring at LEFT base. Question LEFT nipple shadow; followup chest radiograph with nipple markers recommended to exclude pulmonary nodule. Electronically Signed   By: Ulyses Southward M.D.   On: 04/09/2015 13:34   Dg Tibia/fibula Left  04/05/2015  CLINICAL DATA:  Left lower extremity cellulitis. EXAM: LEFT TIBIA AND FIBULA - 2 VIEW COMPARISON:  None. FINDINGS: The knee and ankle joints are maintained. No acute bony findings or destructive bony changes. No gas is seen in the soft tissues. IMPRESSION: No acute bony findings. Electronically Signed   By: Rudie Meyer M.D.   On: 04/12/2015 13:23   Dg Foot Complete Left  04/15/2015  CLINICAL DATA:  Left lower leg and foot erythema and edema of uncertain duration in a diabetic patient. Initial encounter. EXAM: LEFT FOOT - COMPLETE 3+ VIEW COMPARISON:  None. FINDINGS: There is difficulty positioning the patient. The  patient has a fracture through the midshaft of the distal phalanx of the left great toe. The fracture is minimally displaced. No other bony or joint abnormality is identified. No radiopaque foreign body or soft tissue gas collection is seen. IMPRESSION: Mildly displaced fracture distal phalanx of the left great toe. Given the patient's history, coexistent infection in this location is possible. Recommend clinical correlation for adjacent soft tissue wound. Electronically Signed   By: Drusilla Kanner M.D.   On: 04/11/2015 13:24    Scheduled Meds: . apixaban  5 mg Oral BID  . hydrocortisone sod succinate (SOLU-CORTEF) inj  100 mg Intravenous Q8H  . insulin aspart  0-9 Units Subcutaneous TID WC  . insulin glargine  5 Units Subcutaneous Daily  . piperacillin-tazobactam (ZOSYN)  IV  3.375 g Intravenous Q8H  . vancomycin  1,000 mg Intravenous Q8H   Continuous Infusions: . sodium chloride 50 mL/hr  at 03/29/15 1723  . HYDROmorphone 0.5 mg/hr (03/30/15 0759)     Time spent: 30 minutes (>50 % of the time dedicated to face to face examination, provide plan of care and updates to family at bedside)    Vassie Loll  [facility] Pager [phone]. If 7PM-7AM, please contact night-coverage at [email or web address], password [facility] 03/30/2015, 11:34 AM  LOS: 2 days

## 2015-03-31 DIAGNOSIS — E872 Acidosis: Secondary | ICD-10-CM

## 2015-03-31 DIAGNOSIS — Z794 Long term (current) use of insulin: Secondary | ICD-10-CM

## 2015-03-31 DIAGNOSIS — E1165 Type 2 diabetes mellitus with hyperglycemia: Secondary | ICD-10-CM

## 2015-03-31 DIAGNOSIS — I10 Essential (primary) hypertension: Secondary | ICD-10-CM

## 2015-03-31 MED ORDER — HYDROMORPHONE BOLUS VIA INFUSION
2.0000 mg | INTRAVENOUS | Status: DC | PRN
  Administered 2015-03-31 (×3): 2 mg via INTRAVENOUS
  Filled 2015-03-31 (×4): qty 2

## 2015-03-31 MED ORDER — HYDROMORPHONE HCL 1 MG/ML IJ SOLN
1.0000 mg | Freq: Once | INTRAMUSCULAR | Status: AC
  Administered 2015-03-31: 1 mg via INTRAVENOUS

## 2015-03-31 MED ORDER — SCOPOLAMINE 1 MG/3DAYS TD PT72
1.0000 | MEDICATED_PATCH | TRANSDERMAL | Status: DC
  Administered 2015-03-31: 1.5 mg via TRANSDERMAL
  Filled 2015-03-31: qty 1

## 2015-03-31 MED ORDER — HYDROMORPHONE BOLUS VIA INFUSION
2.5000 mg | INTRAVENOUS | Status: DC | PRN
  Administered 2015-03-31 (×5): 2.5 mg via INTRAVENOUS
  Filled 2015-03-31 (×6): qty 3

## 2015-04-01 NOTE — Progress Notes (Signed)
Upon rounding family reports that patient has not taken a breath in about one minute. RN sat with patient and family for a moment. This RN and Bo McclintockKristine Simone, RN verified by auscultation that patient had indeed passed. Time of death 2348. MD notified.

## 2015-04-02 LAB — CULTURE, BLOOD (ROUTINE X 2)
CULTURE: NO GROWTH
Culture: NO GROWTH

## 2015-04-16 NOTE — Care Management Note (Signed)
Case Management Note  Patient Details  Name: Donald Flynn MRN: [id number] Date of Birth: 04/07/1951  Subjective/Objective:        Infection with aki            Action/Plan:Date: March 31, 2015 Chart reviewed for concurrent status and case management needs. Will continue to follow patient for changes and needs: Marcelle Smilinghonda Kiamesha Samet, RN, BSN, [place]   [phone]   Expected Discharge Date:   (UNKNOWN)               Expected Discharge Plan:  Home/Self Care  In-House Referral:  NA  Discharge planning Services  CM Consult  Post Acute Care Choice:  NA Choice offered to:  NA  DME Arranged:    DME Agency:     HH Arranged:    HH Agency:     Status of Service:  In process, will continue to follow  Medicare Important Message Given:    Date Medicare IM Given:    Medicare IM give by:    Date Additional Medicare IM Given:    Additional Medicare Important Message give by:     If discussed at Long Length of Stay Meetings, dates discussed:    Additional Comments:  Golda AcreDavis, Shaylie Eklund Lynn, RN 09-12-14, 9:48 AM

## 2015-04-16 NOTE — Progress Notes (Signed)
Family member contacted palliative care team. Linna DarnerAnwar called this RN. Per family request, continuous dose and bolus dose increased to 2.5mg . Family aware and thankful. Ativan also administered per family request.

## 2015-04-16 NOTE — Progress Notes (Signed)
CSW spoke with Seward GraterMaggie at [facility] concerning d/c. Representative stated she spoke with the family and they were unwilling to make a decision on moving the Pt until they spoke with the doctor.   CSW contacted MD and relayed information. MD stated he will meet with the family to discuss d/c planning.   [facility] does have a bed available both today and tomorrow. CSW awaiting decision from MD.    Leron Croakassandra Graycee Greeson [facility]WA  [facility]  [phone]

## 2015-04-16 NOTE — Progress Notes (Signed)
Remainder of dilaudid drip, about 30ml, was wasted into the sink with Erskine Emeryhomas Ryan, RN. 1mg  IV ativan also wasted into sink at this time by same RN, as patient is no longer in Pyxis.

## 2015-04-16 NOTE — Progress Notes (Signed)
[facility] and [facility] will be evaluating the Pt's information and [facility]   [facility] also [facility] with [facility] and they will do likewise and contact CSW with family's decision.   CSW will continue to work with the family for residential hospice placement.    Leron Croakassandra Harneet Noblett [facility]WA  [facility]  [phone]

## 2015-04-16 NOTE — Progress Notes (Signed)
[facility] PROGRESS NOTE  Donald AllanJoe Flynn ZOX:[id number] DOB: 05/03/1950 DOA: 04/08/2015 PCP: Florentina JennyRIPP, HENRY, MD  Assessment/Plan: 1-Sepsis: In setting of LE cellulitis and UTI.  -Patient presented with lactic acid at 6.6, Hypotensive SBP in the 80, WBC's 14.2, febrile and tachycardic.  -no more antibiotics; no further intervention  -Following GOALS of care decision will focus on full comfort care -will continue solucortef, main intention is to help with pain  -continue dilaudid drip, PRN ativan/haldol, PRN robinul -will start scopolamine patch  -big change in his status from 12/15; will observe for stability before pursuing hospice facility discharge  2-Diabetes, Uncontrolled:  -plan is for full comfort  -will stop insulin and also CBG's checks   3-Acute hypoxic respiratory Failure;  -do to number one -slightly worse today -will continue PRN O2 supplementation  -continue Nebulizer PRN.  -abd breathing appreciated -will use PRN analgesics regimen and also PRN ativan/haldol   4-Chronic pain/neuropathy: Palliative care consulted for symptoms management.  -will continue dilaudid drip and PRN IV dilaudid for breakthrough  -plan is for full comfort -patient no tolerating oral meds  5-chronic A fib:  -CHADVASC score 5 -plan is for full comfort care and no further blood drawn will be done   6-Left LE cellulitis;  -improved with IV antibiotics -now the plan is for full comfort  -ABX's discontinued   7-Anemia of Chronic disease: no signs of active bleeding  -plan is for full comfort care and no further blood drawn will be done   8-Ankylosing spondylitis: -chronic and debilitating  -steadily declining and currently bed bound -continue pain meds (mange/adjusted by Palliative Care service)  -plan is for full comfort care   9-pressure ulcer: -will follow WOC rec's -continue air mattress   Code Status: DNR/DNI Family Communication: Sisters and son at bedside  Disposition  Plan: hospice care if stable enough for transfer; active /steady declining since 2013; do not want any artificial support. Will focus on full comfort care management    Consultants:  Palliative Care   Procedures:  See below for x-ray reports   Antibiotics:  Vancomycin 12/13>>12/16  Zosyn 12/13>>12/16  HPI/Subjective: Afebrile, with stable BP; but patient lethargic/obtunded and presenting abdominal breathing and signs of increase secretions on his upper airways   Objective: Filed Vitals:   03/30/15 0604 November 27, 2014 0538  BP: 155/65 173/83  Pulse: 91 101  Temp: 97.9 F (36.6 C) 96.5 F (35.8 C)  Resp: 18 12    Intake/Output Summary (Last 24 hours) at November 27, 2014 1557 Last data filed at November 27, 2014 0538  Gross per 24 hour  Intake      0 ml  Output   1725 ml  Net  -1725 ml   Filed Weights   03/29/2015 1219 03/30/15 0500  Weight: 83.915 kg (185 lb) 88.451 kg (195 lb)    Exam:   General:  Afebrile, lethargic/obtunded today (12/16); patient also with abdominal breathing and some increase oral secretions. On Dilaudid drip and also dilaudid PRN. Plan is for full comfort care  Cardiovascular: S1 and S2, no rubs or gallops  Respiratory: mild exp wheezing, no rales; no use of accessory muscles   Abdomen: soft, NT, ND, positive BS  Musculoskeletal: trace edema bilaterally, scattered scabs on both legs appreciated; left LE with improved erythema, slight warm sensation on palpation   Data Reviewed: Basic Metabolic Panel:  Recent Labs Lab 04/02/2015 1152 03/27/2015 1228 03/29/15 0605  NA 140 139 142  K 3.8 3.7 4.0  CL 99* 100* 108  CO2 25  --  22  GLUCOSE 403* 401* 384*  BUN CREATININE 1.11 0.90 0.75  CALCIUM 8.6*  --  7.3*   Liver Function Tests:  Recent Labs Lab 04-01-2015 1152  AST 24  ALT 18  ALKPHOS 106  BILITOT 0.8  PROT 6.4*  ALBUMIN 2.4*    Recent Labs Lab 04/01/15 1204  LIPASE 20   CBC:  Recent Labs Lab 04/01/15 1152 2015-04-01 1228  03/29/15 0605  WBC 14.2*  --  9.8  HGB 10.9* 13.6 8.7*  HCT 36.0* 40.0 29.1*  MCV 86.1  --  84.3  PLT 299  --  292   CBG:  Recent Labs Lab 03/29/15 1248 03/29/15 1635 03/29/15 2255 03/30/15 0744 03/30/15 1215  GLUCAP 302* 309* 271* 343* 288*    Recent Results (from the past 240 hour(s))  Blood Culture (routine x 2)     Status: None (Preliminary result)   Collection Time: 04-01-15 12:05 PM  Result Value Ref Range Status   Specimen Description BLOOD LEFT HAND  Final   Special Requests IN PEDIATRIC BOTTLE  Final   Culture   Final    NO GROWTH 3 DAYS Performed at [facility]    Report Status PENDING  Incomplete  Blood Culture (routine x 2)     Status: None (Preliminary result)   Collection Time: 04-01-15 12:20 PM  Result Value Ref Range Status   Specimen Description BLOOD RIGHT ANTECUBITAL  Final   Special Requests IN PEDIATRIC BOTTLE  Final   Culture   Final    NO GROWTH 3 DAYS Performed at [facility]    Report Status PENDING  Incomplete  Urine culture     Status: None   Collection Time: Apr 01, 2015 12:28 PM  Result Value Ref Range Status   Specimen Description URINE, CLEAN CATCH  Final   Special Requests NONE  Final   Culture   Final    MULTIPLE SPECIES PRESENT, SUGGEST RECOLLECTION Performed at [facility]    Report Status 03/30/2015 FINAL  Final     Studies: No results found.  Scheduled Meds: . hydrocortisone sod succinate (SOLU-CORTEF) inj  50 mg Intravenous Q12H  . insulin glargine  5 Units Subcutaneous Daily  . scopolamine  1 patch Transdermal Q72H   Continuous Infusions: . sodium chloride 10 mL/hr at 03/30/15 2031  . HYDROmorphone 0.5 mg/hr (03/30/15 2030)     Time spent: 30 minutes (>50 % of the time dedicated to face to face examination, provide plan of care and updates to family at bedside)    Vassie Loll  [facility] Pager [phone]. If 7PM-7AM, please contact night-coverage at [email or web address],  password [facility] 04/14/2015, 3:57 PM  LOS: 3 days

## 2015-04-16 NOTE — Discharge Summary (Addendum)
Death Summary  Dana AllanJoe Goyne ZOX:[id number] DOB: 12/22/1950 DOA: 09-17-14  PCP: Florentina JennyRIPP, HENRY, MD PCP/Office notified: Notified through electronic records (EPIC)  Admit date: 09-17-14 Date of Death: 04/04/2015 at 11:48 PM  Final Diagnoses:  Sepsis (HCC) UTI (lower urinary tract infection) Cellulitis of Left lower extremity Diabetes type 2 with neuropathy (HCC) Decubitus skin ulcer Ankylosing Spondylitis  Lactic acidosis  Chronic pain syndrome  Dysphagia   History of present illness:  65 y.o. male HTN, Seizure, Diabetes, Encephalitis, A fib, CVA, patient has had multiple admissions at [facility] [place] , for sepsis. Patient was found to have altered mental status today. He Moved 2 weeks ago from [place] to lives near his sister. He was found to be lethargic; Per sister, patient has history of ankylosis spondylitis, he is on prednisone. He them develops diabetes. Neuropathy. He has herpes encephalitis 2013. He has had multiples admission for sepsis, secondary to LE/foot infections. He is on chronic pain medications. Patient's sister is requesting palliative care consult for symptoms management and goals of care.   Hospital Course:  1-Sepsis: In setting of LE cellulitis and UTI.  -Patient presented with lactic acid at 6.6, Hypotensive SBP in the 80, WBC's 14.2, febrile and tachycardic.  -no more antibiotics; no further intervention and just full comfort care Following GOALS of care decision on advance directives -patient with rapid decline and experiencing mild short apneic event along with abdominal breathing (new from 03/30/15); he was also more lethargic and with zero interaction  -Comfort measures were maintained (Dilaudid drip, PRN ativan/haldol, PRN robinul and scopolamine) -will start scopolamine patch -plan was to transfer him to residential hospice if stable; but he continue rapidly declining and on 04/11/2015 at 11:48 he expired.  2-Diabetes, Uncontrolled:  -plan  was for full comfort  -So insulin regimen and also CBG's checks were discontinued   3-Acute hypoxic respiratory Failure;  -do to number one -manage with comfort measures -experiencing mild apnea episodes and abdominal breathing  -was kept on O2 supplementation and PRN Nebulizer PRN ativan and haldol available for EOL agitation and comfort  4-Chronic pain/neuropathy: Palliative care consulted for symptoms management.  -will continue dilaudid drip and PRN IV dilaudid for breakthrough  -plan was for full comfort -patient no tolerating oral meds and essentially unable to eat or drink  -he expired at 11:48 pm on 12/16  5-chronic A fib:  -CHADVASC score 5 -plan was for full comfort care and medications not intended for comfort were discontinue; patient was also unable to swallow his medications anymore.  6-Left LE cellulitis;  -improved with IV antibiotics; unfortunately giving his overall poor quality and decisions for full comfort care; his ABX's were discontinued and only supportive care was offered. -Patient expired on 12/16 at 11:48 pm  7-Anemia of Chronic disease: no signs of active bleeding  -plan was for full comfort care and no further blood drawn done   8-Ankylosing spondylitis: -chronic and debilitating  -steadily declining and causing patient to become bed bound -Per GOC discussion and decision on advance directives, plan was for full comfort care and hospice care once stable. -Patient experienced rapid decline and ended passing away on 12/16 at 11:48 PM  9-pressure ulcer: -WOC rec's were follow and patient kept on an air mattress    Time: 25 minutes  Signed:  Vassie LollMadera, Daphene Chisholm  [facility] 04/01/2015, 8:02 AM

## 2015-04-16 DEATH — deceased

## 2016-11-30 IMAGING — CR DG FOOT COMPLETE 3+V*L*
2 series · 2 of 2 positions shown · non-contrast
Comparison: None.

CLINICAL DATA: Left lower leg and foot erythema and edema of
uncertain duration in a diabetic patient. Initial encounter.

EXAM:
LEFT FOOT - COMPLETE 3+ VIEW

[x foot ap left]
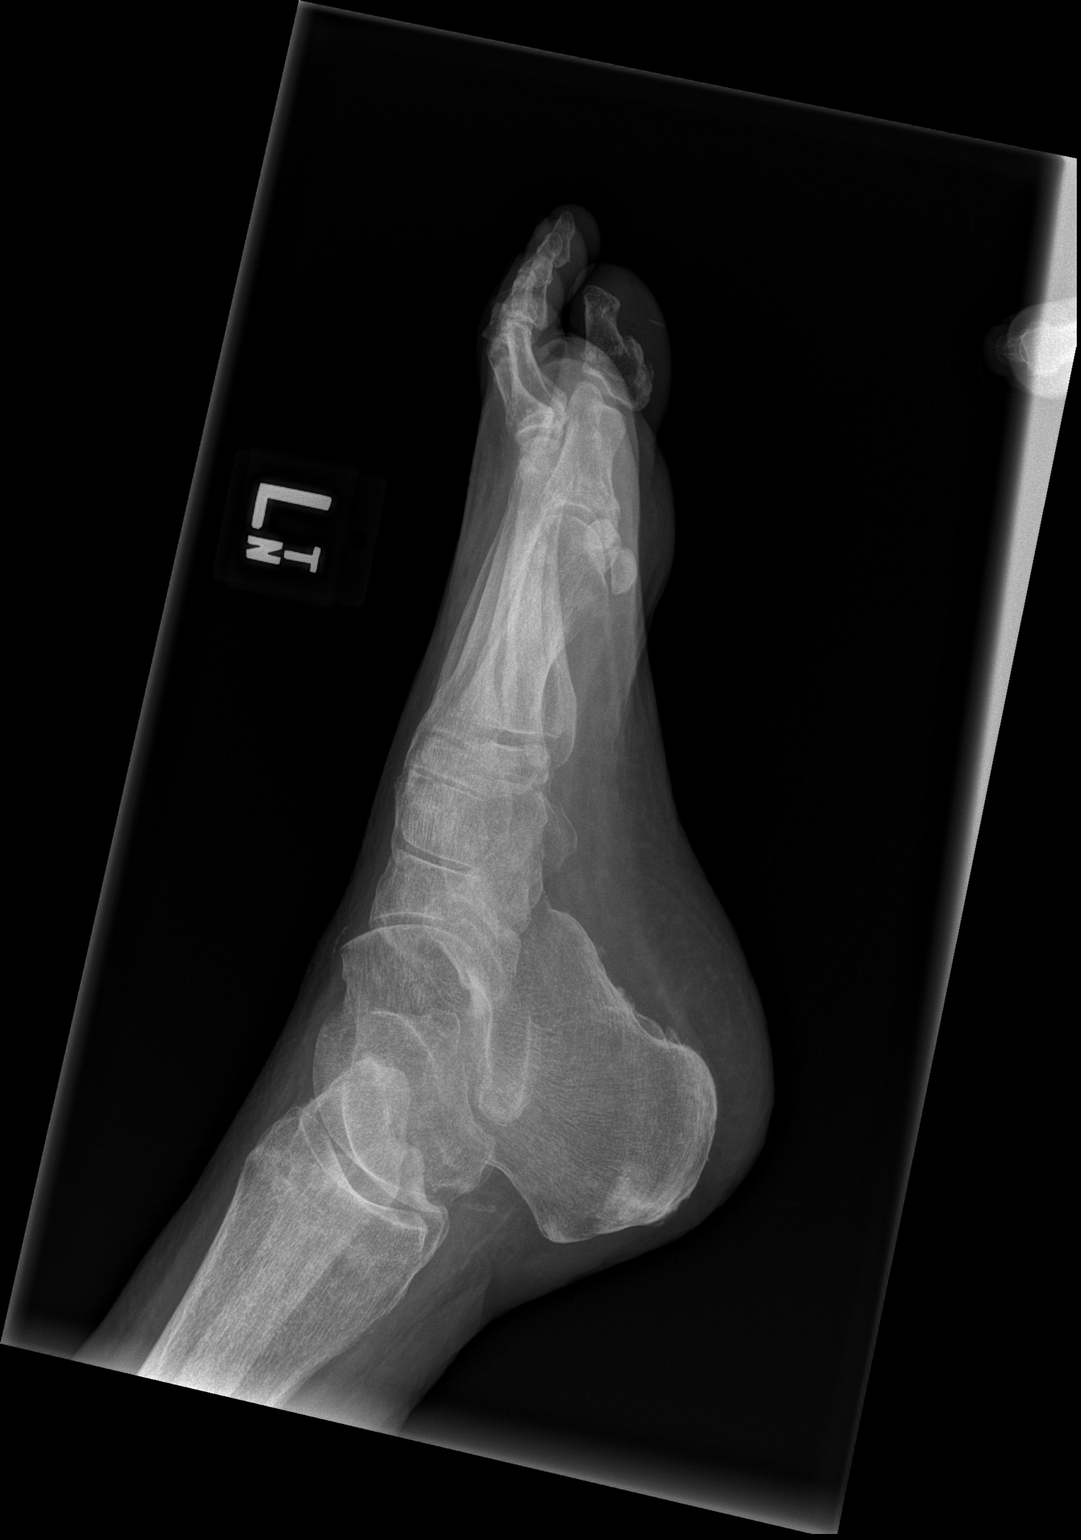

[x foot obl left]
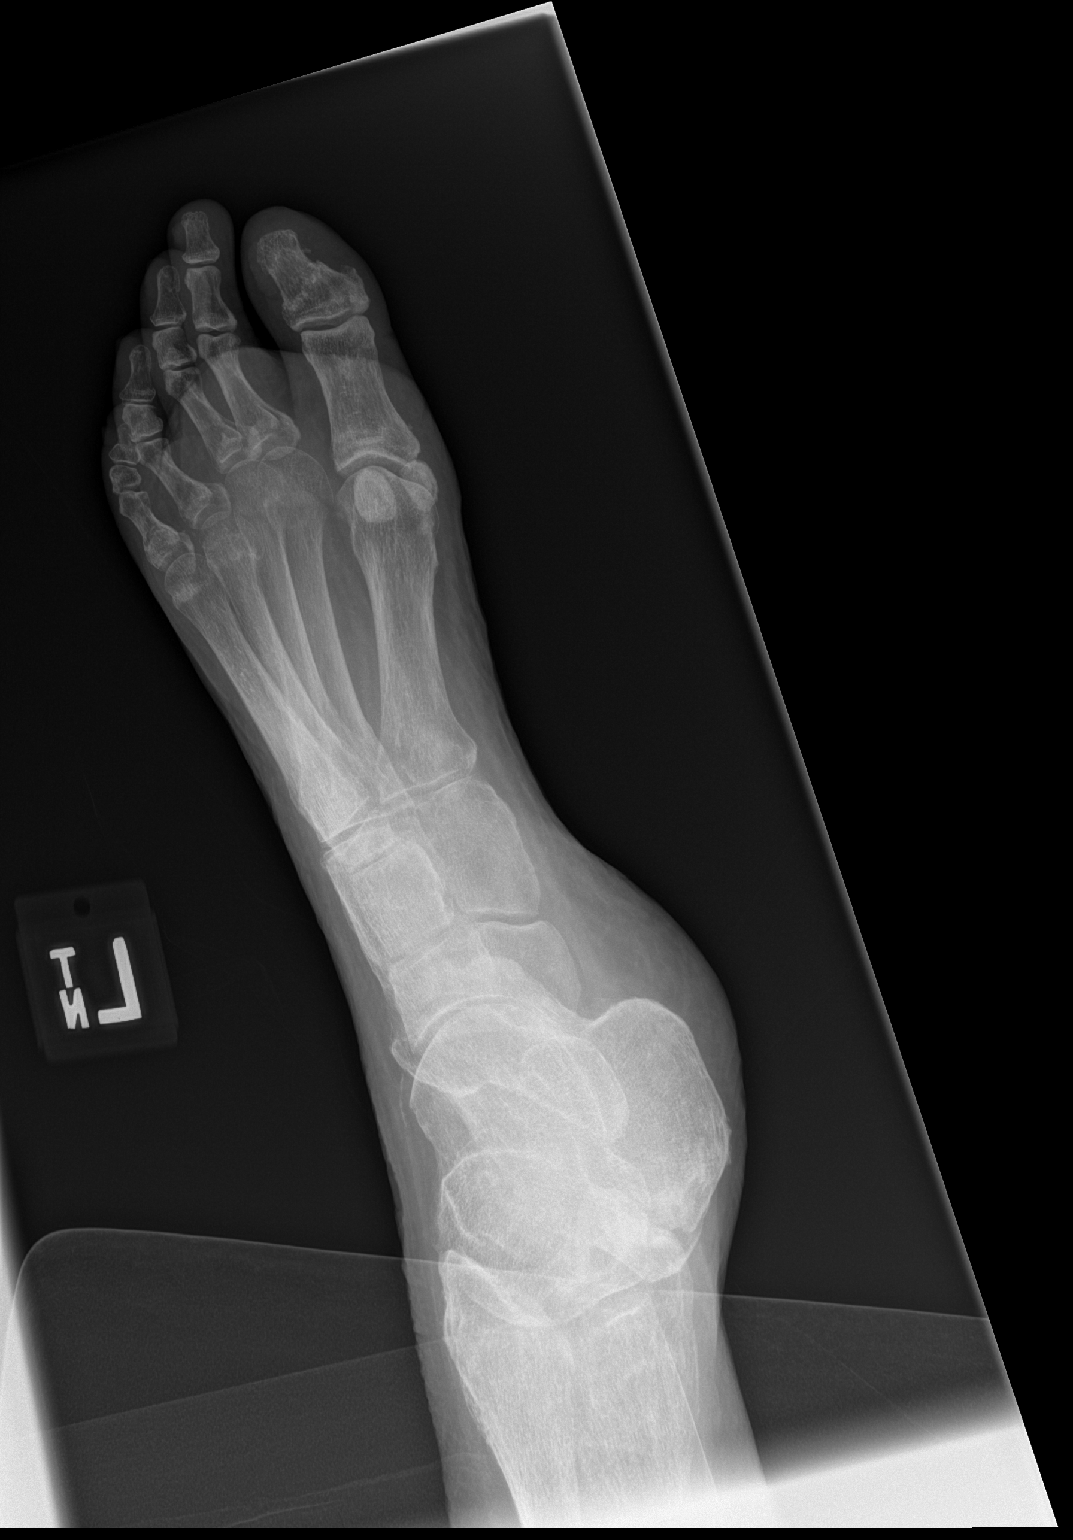

[2 of 2 positions shown; findings below may reference images not displayed]

FINDINGS: There is difficulty positioning the patient. The patient has a
fracture through the midshaft of the distal phalanx of the left
great toe. The fracture is minimally displaced. No other bony or
joint abnormality is identified. No radiopaque foreign body or soft
tissue gas collection is seen.
IMPRESSION: Mildly displaced fracture distal phalanx of the left great toe.
Given the patient's history, coexistent infection in this location
is possible. Recommend clinical correlation for adjacent soft tissue
wound.

## 2016-11-30 IMAGING — CR DG TIBIA/FIBULA 2V*L*
3 series · 3 of 3 positions shown · non-contrast
Comparison: None.

CLINICAL DATA: Left lower extremity cellulitis.

EXAM:
LEFT TIBIA AND FIBULA - 2 VIEW

[x tib-fib ap left]
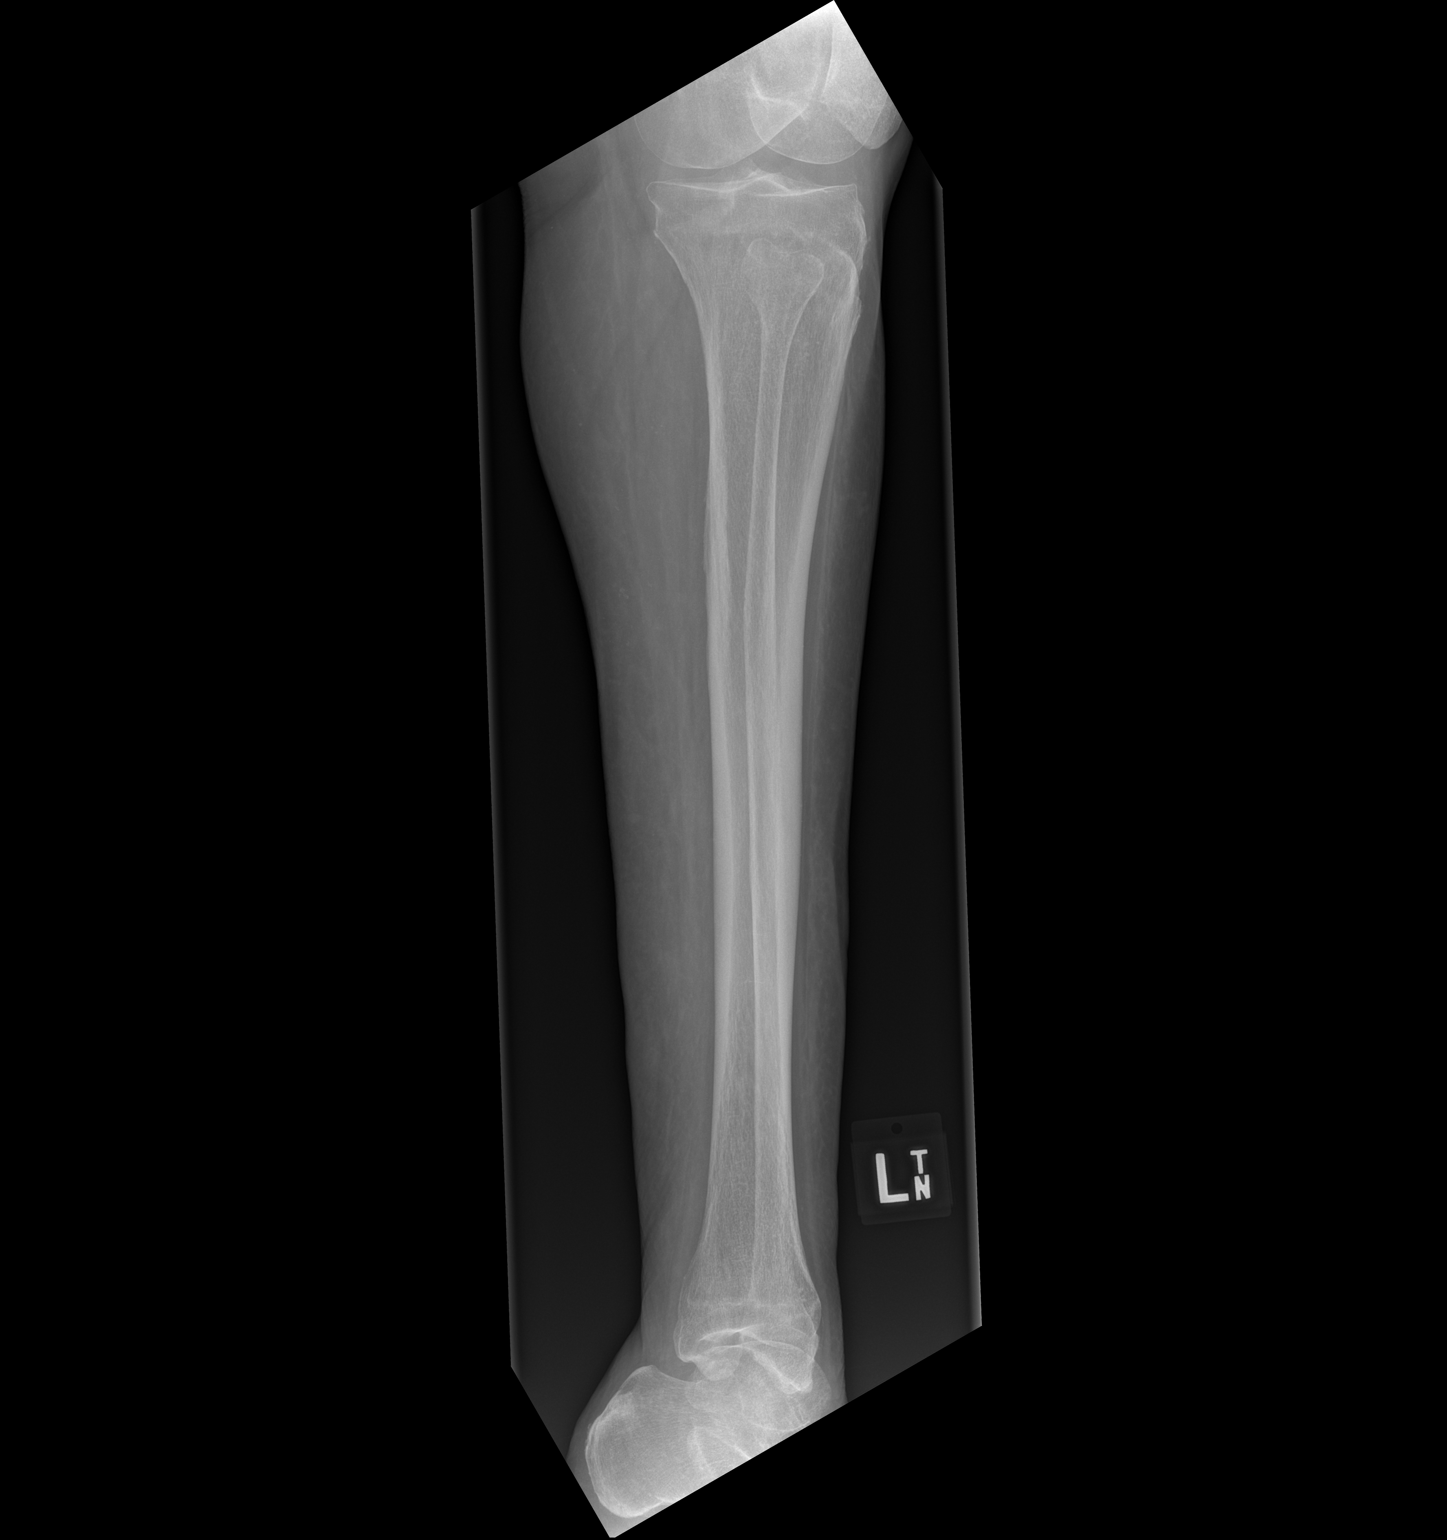

[x tib-fib lat left (1 of 2)]
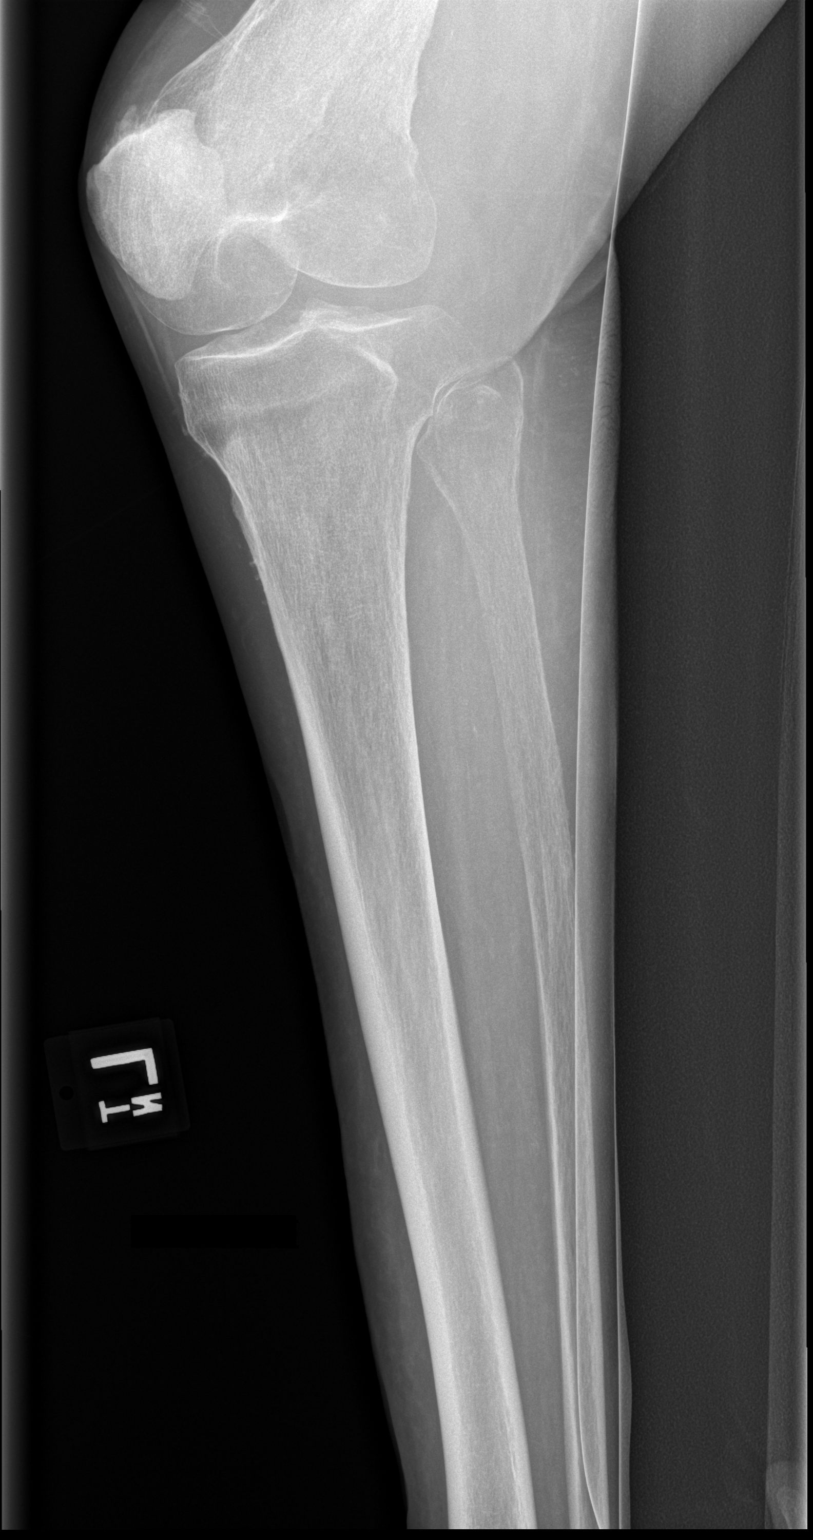

[x tib-fib lat left (2 of 2)]
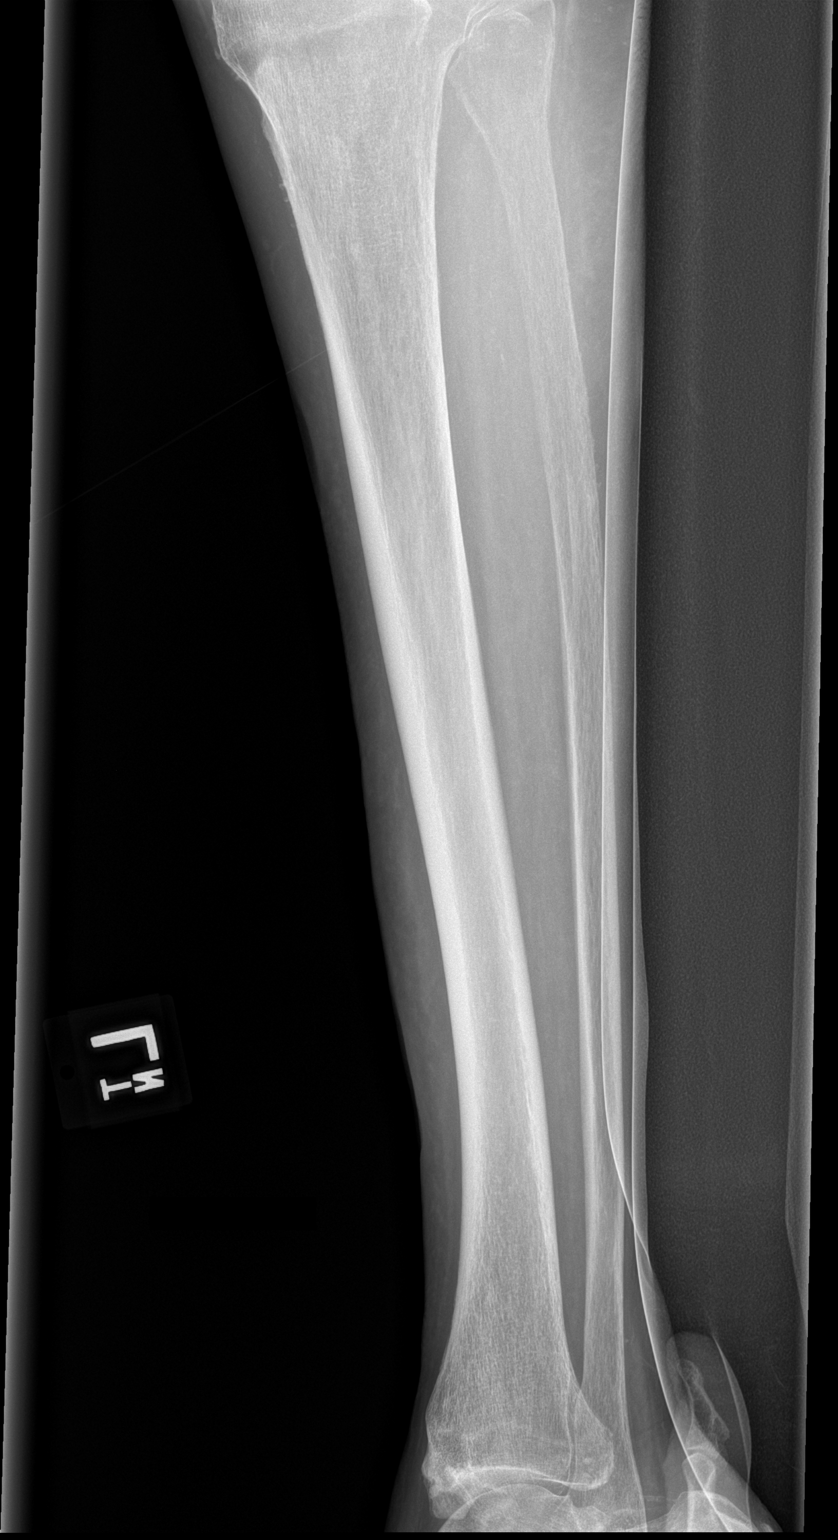

[3 of 3 positions shown; findings below may reference images not displayed]

FINDINGS: The knee and ankle joints are maintained. No acute bony findings or
destructive bony changes. No gas is seen in the soft tissues.
IMPRESSION: No acute bony findings.
